# Patient Record
Sex: Female | Born: 1960 | Race: White | Hispanic: No | Marital: Married | Smoking: Former smoker
Health system: Southern US, Community
[De-identification: ages and names within clinical notes are randomized; demographics above are authoritative.]

## PROBLEM LIST (undated history)

## (undated) DIAGNOSIS — F32A Depression, unspecified: Secondary | ICD-10-CM

## (undated) DIAGNOSIS — S83519A Sprain of anterior cruciate ligament of unspecified knee, initial encounter: Secondary | ICD-10-CM

## (undated) DIAGNOSIS — Z22322 Carrier or suspected carrier of Methicillin resistant Staphylococcus aureus: Secondary | ICD-10-CM

## (undated) DIAGNOSIS — F319 Bipolar disorder, unspecified: Secondary | ICD-10-CM

## (undated) DIAGNOSIS — F419 Anxiety disorder, unspecified: Secondary | ICD-10-CM

## (undated) DIAGNOSIS — F329 Major depressive disorder, single episode, unspecified: Secondary | ICD-10-CM

## (undated) DIAGNOSIS — T7840XA Allergy, unspecified, initial encounter: Secondary | ICD-10-CM

## (undated) DIAGNOSIS — M1990 Unspecified osteoarthritis, unspecified site: Secondary | ICD-10-CM

## (undated) DIAGNOSIS — F909 Attention-deficit hyperactivity disorder, unspecified type: Secondary | ICD-10-CM

## (undated) DIAGNOSIS — R112 Nausea with vomiting, unspecified: Secondary | ICD-10-CM

## (undated) DIAGNOSIS — Z9889 Other specified postprocedural states: Secondary | ICD-10-CM

## (undated) HISTORY — DX: Carrier or suspected carrier of methicillin resistant Staphylococcus aureus: Z22.322

## (undated) HISTORY — DX: Sprain of anterior cruciate ligament of unspecified knee, initial encounter: S83.519A

## (undated) HISTORY — DX: Allergy, unspecified, initial encounter: T78.40XA

## (undated) HISTORY — DX: Unspecified osteoarthritis, unspecified site: M19.90

## (undated) HISTORY — PX: EXPLORATORY LAPAROTOMY: SUR591

## (undated) HISTORY — DX: Attention-deficit hyperactivity disorder, unspecified type: F90.9

## (undated) HISTORY — PX: ABDOMINAL HYSTERECTOMY: SHX81

---

## 1997-10-09 ENCOUNTER — Inpatient Hospital Stay (HOSPITAL_COMMUNITY): Admission: AD | Admit: 1997-10-09 | Discharge: 1997-10-11 | Payer: Self-pay | Admitting: Obstetrics and Gynecology

## 1997-10-09 ENCOUNTER — Inpatient Hospital Stay (HOSPITAL_COMMUNITY): Admission: AD | Admit: 1997-10-09 | Discharge: 1997-10-09 | Payer: Self-pay | Admitting: *Deleted

## 1997-11-08 ENCOUNTER — Other Ambulatory Visit: Admission: RE | Admit: 1997-11-08 | Discharge: 1997-11-08 | Payer: Self-pay | Admitting: *Deleted

## 1998-12-22 ENCOUNTER — Other Ambulatory Visit: Admission: RE | Admit: 1998-12-22 | Discharge: 1998-12-22 | Payer: Self-pay | Admitting: *Deleted

## 1999-05-13 ENCOUNTER — Observation Stay (HOSPITAL_COMMUNITY): Admission: AD | Admit: 1999-05-13 | Discharge: 1999-05-13 | Payer: Self-pay | Admitting: Obstetrics and Gynecology

## 1999-08-28 ENCOUNTER — Inpatient Hospital Stay (HOSPITAL_COMMUNITY): Admission: AD | Admit: 1999-08-28 | Discharge: 1999-08-28 | Payer: Self-pay | Admitting: Obstetrics and Gynecology

## 1999-09-02 ENCOUNTER — Encounter (HOSPITAL_COMMUNITY): Admission: RE | Admit: 1999-09-02 | Discharge: 1999-10-06 | Payer: Self-pay | Admitting: Obstetrics and Gynecology

## 1999-09-08 ENCOUNTER — Inpatient Hospital Stay (HOSPITAL_COMMUNITY): Admission: AD | Admit: 1999-09-08 | Discharge: 1999-09-08 | Payer: Self-pay | Admitting: Obstetrics and Gynecology

## 1999-09-09 ENCOUNTER — Inpatient Hospital Stay (HOSPITAL_COMMUNITY): Admission: RE | Admit: 1999-09-09 | Discharge: 1999-09-09 | Payer: Self-pay | Admitting: Obstetrics & Gynecology

## 1999-09-09 ENCOUNTER — Encounter: Payer: Self-pay | Admitting: *Deleted

## 1999-09-14 ENCOUNTER — Inpatient Hospital Stay (HOSPITAL_COMMUNITY): Admission: AD | Admit: 1999-09-14 | Discharge: 1999-09-16 | Payer: Self-pay | Admitting: *Deleted

## 1999-10-05 ENCOUNTER — Inpatient Hospital Stay (HOSPITAL_COMMUNITY): Admission: AD | Admit: 1999-10-05 | Discharge: 1999-10-08 | Payer: Self-pay | Admitting: *Deleted

## 1999-10-05 ENCOUNTER — Encounter (INDEPENDENT_AMBULATORY_CARE_PROVIDER_SITE_OTHER): Payer: Self-pay

## 1999-11-18 ENCOUNTER — Other Ambulatory Visit: Admission: RE | Admit: 1999-11-18 | Discharge: 1999-11-18 | Payer: Self-pay | Admitting: *Deleted

## 2000-12-13 ENCOUNTER — Other Ambulatory Visit: Admission: RE | Admit: 2000-12-13 | Discharge: 2000-12-13 | Payer: Self-pay | Admitting: *Deleted

## 2002-01-18 ENCOUNTER — Other Ambulatory Visit: Admission: RE | Admit: 2002-01-18 | Discharge: 2002-01-18 | Payer: Self-pay | Admitting: *Deleted

## 2003-01-22 ENCOUNTER — Other Ambulatory Visit: Admission: RE | Admit: 2003-01-22 | Discharge: 2003-01-22 | Payer: Self-pay | Admitting: *Deleted

## 2003-08-20 ENCOUNTER — Encounter: Payer: Self-pay | Admitting: Internal Medicine

## 2003-11-20 ENCOUNTER — Encounter: Payer: Self-pay | Admitting: Internal Medicine

## 2004-02-05 ENCOUNTER — Other Ambulatory Visit: Admission: RE | Admit: 2004-02-05 | Discharge: 2004-02-05 | Payer: Self-pay | Admitting: Obstetrics & Gynecology

## 2004-05-26 ENCOUNTER — Encounter (INDEPENDENT_AMBULATORY_CARE_PROVIDER_SITE_OTHER): Payer: Self-pay | Admitting: Specialist

## 2004-05-26 ENCOUNTER — Observation Stay (HOSPITAL_COMMUNITY): Admission: RE | Admit: 2004-05-26 | Discharge: 2004-05-27 | Payer: Self-pay | Admitting: Obstetrics & Gynecology

## 2004-07-05 HISTORY — PX: AUGMENTATION MAMMAPLASTY: SUR837

## 2005-03-03 ENCOUNTER — Encounter (INDEPENDENT_AMBULATORY_CARE_PROVIDER_SITE_OTHER): Payer: Self-pay | Admitting: Specialist

## 2005-03-03 ENCOUNTER — Ambulatory Visit (HOSPITAL_BASED_OUTPATIENT_CLINIC_OR_DEPARTMENT_OTHER): Admission: RE | Admit: 2005-03-03 | Discharge: 2005-03-03 | Payer: Self-pay | Admitting: Plastic Surgery

## 2005-03-24 ENCOUNTER — Ambulatory Visit: Payer: Self-pay | Admitting: Internal Medicine

## 2005-04-13 ENCOUNTER — Other Ambulatory Visit: Admission: RE | Admit: 2005-04-13 | Discharge: 2005-04-13 | Payer: Self-pay | Admitting: Obstetrics & Gynecology

## 2006-01-04 ENCOUNTER — Ambulatory Visit: Payer: Self-pay | Admitting: Family Medicine

## 2006-02-08 ENCOUNTER — Ambulatory Visit: Payer: Self-pay | Admitting: Internal Medicine

## 2006-02-22 ENCOUNTER — Encounter: Admission: RE | Admit: 2006-02-22 | Discharge: 2006-02-22 | Payer: Self-pay | Admitting: Internal Medicine

## 2006-07-21 ENCOUNTER — Ambulatory Visit: Payer: Self-pay | Admitting: Internal Medicine

## 2006-08-09 ENCOUNTER — Ambulatory Visit: Payer: Self-pay | Admitting: Vascular Surgery

## 2006-08-22 ENCOUNTER — Ambulatory Visit: Payer: Self-pay | Admitting: Internal Medicine

## 2006-10-31 ENCOUNTER — Ambulatory Visit: Payer: Self-pay | Admitting: Internal Medicine

## 2006-11-21 ENCOUNTER — Ambulatory Visit: Payer: Self-pay | Admitting: Internal Medicine

## 2006-12-28 ENCOUNTER — Encounter: Payer: Self-pay | Admitting: Internal Medicine

## 2006-12-28 DIAGNOSIS — J309 Allergic rhinitis, unspecified: Secondary | ICD-10-CM | POA: Insufficient documentation

## 2006-12-28 DIAGNOSIS — F909 Attention-deficit hyperactivity disorder, unspecified type: Secondary | ICD-10-CM | POA: Insufficient documentation

## 2006-12-28 DIAGNOSIS — F39 Unspecified mood [affective] disorder: Secondary | ICD-10-CM | POA: Insufficient documentation

## 2007-02-21 ENCOUNTER — Ambulatory Visit: Payer: Self-pay | Admitting: Internal Medicine

## 2007-02-21 DIAGNOSIS — J45909 Unspecified asthma, uncomplicated: Secondary | ICD-10-CM | POA: Insufficient documentation

## 2007-03-03 ENCOUNTER — Telehealth: Payer: Self-pay | Admitting: Internal Medicine

## 2007-03-09 ENCOUNTER — Telehealth: Payer: Self-pay | Admitting: Internal Medicine

## 2007-03-15 ENCOUNTER — Encounter: Payer: Self-pay | Admitting: Internal Medicine

## 2007-03-23 ENCOUNTER — Ambulatory Visit: Payer: Self-pay | Admitting: Cardiology

## 2007-03-23 ENCOUNTER — Ambulatory Visit: Payer: Self-pay | Admitting: Internal Medicine

## 2007-03-23 LAB — CONVERTED CEMR LAB
Bilirubin Urine: NEGATIVE
Blood in Urine, dipstick: NEGATIVE
Glucose, Urine, Semiquant: NEGATIVE
Nitrite: NEGATIVE
Protein, U semiquant: 30
Specific Gravity, Urine: <1.005
Urobilinogen, UA: 0.2
WBC Urine, dipstick: NEGATIVE
pH: 8

## 2007-03-24 LAB — CONVERTED CEMR LAB
ALT: 21 units/L (ref 0–35)
AST: 17 units/L (ref 0–37)
Albumin: 3.9 g/dL (ref 3.5–5.2)
Alkaline Phosphatase: 51 units/L (ref 39–117)
Amylase: 66 units/L (ref 27–131)
BUN: 5 mg/dL — ABNORMAL LOW (ref 6–23)
Basophils Absolute: 0 10*3/uL (ref 0.0–0.1)
Basophils Relative: 0.4 % (ref 0.0–1.0)
Bilirubin, Direct: 0.1 mg/dL (ref 0.0–0.3)
CO2: 31 meq/L (ref 19–32)
Calcium: 10.3 mg/dL (ref 8.4–10.5)
Chloride: 103 meq/L (ref 96–112)
Creatinine, Ser: 0.7 mg/dL (ref 0.4–1.2)
Eosinophils Absolute: 0.2 10*3/uL (ref 0.0–0.6)
Eosinophils Relative: 3.2 % (ref 0.0–5.0)
GFR calc Af Amer: 115 mL/min
GFR calc non Af Amer: 95 mL/min
Glucose, Bld: 109 mg/dL — ABNORMAL HIGH (ref 70–99)
HCT: 43 % (ref 36.0–46.0)
Hemoglobin: 14.7 g/dL (ref 12.0–15.0)
Lipase: 21 units/L (ref 11.0–59.0)
Lymphocytes Relative: 21 % (ref 12.0–46.0)
MCHC: 34.1 g/dL (ref 30.0–36.0)
MCV: 91.8 fL (ref 78.0–100.0)
Monocytes Absolute: 0.5 10*3/uL (ref 0.2–0.7)
Monocytes Relative: 8.1 % (ref 3.0–11.0)
Neutro Abs: 4 10*3/uL (ref 1.4–7.7)
Neutrophils Relative %: 67.3 % (ref 43.0–77.0)
Platelets: 191 10*3/uL (ref 150–400)
Potassium: 4.7 meq/L (ref 3.5–5.1)
RBC: 4.68 M/uL (ref 3.87–5.11)
RDW: 12.4 % (ref 11.5–14.6)
Sodium: 141 meq/L (ref 135–145)
Total Bilirubin: 0.8 mg/dL (ref 0.3–1.2)
Total Protein: 6.8 g/dL (ref 6.0–8.3)
WBC: 5.9 10*3/uL (ref 4.5–10.5)

## 2007-04-14 ENCOUNTER — Telehealth: Payer: Self-pay | Admitting: Family Medicine

## 2007-04-18 ENCOUNTER — Telehealth: Payer: Self-pay | Admitting: Internal Medicine

## 2007-05-10 ENCOUNTER — Telehealth: Payer: Self-pay | Admitting: Internal Medicine

## 2007-05-16 ENCOUNTER — Encounter: Payer: Self-pay | Admitting: Internal Medicine

## 2007-05-18 ENCOUNTER — Telehealth: Payer: Self-pay | Admitting: Internal Medicine

## 2007-05-19 ENCOUNTER — Telehealth (INDEPENDENT_AMBULATORY_CARE_PROVIDER_SITE_OTHER): Payer: Self-pay | Admitting: *Deleted

## 2007-06-14 ENCOUNTER — Telehealth: Payer: Self-pay | Admitting: *Deleted

## 2007-06-15 ENCOUNTER — Telehealth: Payer: Self-pay | Admitting: *Deleted

## 2007-07-17 ENCOUNTER — Telehealth: Payer: Self-pay | Admitting: Internal Medicine

## 2007-07-20 ENCOUNTER — Telehealth: Payer: Self-pay | Admitting: Internal Medicine

## 2007-07-24 ENCOUNTER — Encounter: Payer: Self-pay | Admitting: Internal Medicine

## 2007-10-18 ENCOUNTER — Telehealth: Payer: Self-pay | Admitting: Internal Medicine

## 2007-11-17 ENCOUNTER — Telehealth: Payer: Self-pay | Admitting: Internal Medicine

## 2008-03-18 ENCOUNTER — Telehealth: Payer: Self-pay | Admitting: *Deleted

## 2008-04-22 ENCOUNTER — Ambulatory Visit: Payer: Self-pay | Admitting: Internal Medicine

## 2008-05-02 ENCOUNTER — Telehealth: Payer: Self-pay | Admitting: *Deleted

## 2008-09-02 ENCOUNTER — Telehealth: Payer: Self-pay | Admitting: Internal Medicine

## 2008-09-27 ENCOUNTER — Encounter: Payer: Self-pay | Admitting: Internal Medicine

## 2008-10-08 ENCOUNTER — Ambulatory Visit: Payer: Self-pay | Admitting: Internal Medicine

## 2008-10-08 DIAGNOSIS — M129 Arthropathy, unspecified: Secondary | ICD-10-CM | POA: Insufficient documentation

## 2008-10-08 DIAGNOSIS — M5030 Other cervical disc degeneration, unspecified cervical region: Secondary | ICD-10-CM | POA: Insufficient documentation

## 2008-10-08 DIAGNOSIS — M542 Cervicalgia: Secondary | ICD-10-CM

## 2008-12-04 ENCOUNTER — Telehealth: Payer: Self-pay | Admitting: Internal Medicine

## 2009-02-27 ENCOUNTER — Ambulatory Visit: Payer: Self-pay | Admitting: Vascular Surgery

## 2009-03-17 ENCOUNTER — Telehealth: Payer: Self-pay | Admitting: *Deleted

## 2009-03-31 ENCOUNTER — Ambulatory Visit: Payer: Self-pay | Admitting: Family Medicine

## 2009-04-01 ENCOUNTER — Telehealth: Payer: Self-pay | Admitting: Family Medicine

## 2009-04-02 ENCOUNTER — Ambulatory Visit: Payer: Self-pay | Admitting: Family Medicine

## 2009-04-09 ENCOUNTER — Ambulatory Visit: Payer: Self-pay | Admitting: Internal Medicine

## 2009-04-09 DIAGNOSIS — L02219 Cutaneous abscess of trunk, unspecified: Secondary | ICD-10-CM

## 2009-04-09 DIAGNOSIS — L03319 Cellulitis of trunk, unspecified: Secondary | ICD-10-CM

## 2009-04-09 DIAGNOSIS — R030 Elevated blood-pressure reading, without diagnosis of hypertension: Secondary | ICD-10-CM

## 2009-04-10 ENCOUNTER — Encounter: Payer: Self-pay | Admitting: Internal Medicine

## 2009-04-15 ENCOUNTER — Telehealth: Payer: Self-pay | Admitting: Internal Medicine

## 2009-04-30 ENCOUNTER — Telehealth: Payer: Self-pay | Admitting: *Deleted

## 2009-05-27 ENCOUNTER — Telehealth: Payer: Self-pay | Admitting: Internal Medicine

## 2009-06-24 ENCOUNTER — Telehealth: Payer: Self-pay | Admitting: Internal Medicine

## 2009-08-01 ENCOUNTER — Telehealth: Payer: Self-pay | Admitting: Internal Medicine

## 2009-08-02 ENCOUNTER — Encounter: Payer: Self-pay | Admitting: Internal Medicine

## 2009-08-14 ENCOUNTER — Encounter: Payer: Self-pay | Admitting: Internal Medicine

## 2009-08-14 ENCOUNTER — Ambulatory Visit: Payer: Self-pay | Admitting: Vascular Surgery

## 2009-08-19 ENCOUNTER — Encounter: Payer: Self-pay | Admitting: Internal Medicine

## 2009-09-11 ENCOUNTER — Encounter: Payer: Self-pay | Admitting: Internal Medicine

## 2009-09-19 ENCOUNTER — Encounter: Payer: Self-pay | Admitting: Internal Medicine

## 2009-11-03 ENCOUNTER — Telehealth: Payer: Self-pay | Admitting: Internal Medicine

## 2009-11-11 ENCOUNTER — Ambulatory Visit: Payer: Self-pay | Admitting: Internal Medicine

## 2009-12-03 ENCOUNTER — Telehealth: Payer: Self-pay | Admitting: *Deleted

## 2009-12-25 ENCOUNTER — Ambulatory Visit: Payer: Self-pay | Admitting: Internal Medicine

## 2009-12-25 ENCOUNTER — Observation Stay (HOSPITAL_COMMUNITY): Admission: EM | Admit: 2009-12-25 | Discharge: 2009-12-26 | Payer: Self-pay | Admitting: Emergency Medicine

## 2009-12-25 ENCOUNTER — Ambulatory Visit: Payer: Self-pay | Admitting: Cardiology

## 2009-12-25 DIAGNOSIS — R0602 Shortness of breath: Secondary | ICD-10-CM

## 2009-12-26 ENCOUNTER — Encounter (INDEPENDENT_AMBULATORY_CARE_PROVIDER_SITE_OTHER): Payer: Self-pay | Admitting: Emergency Medicine

## 2010-02-23 ENCOUNTER — Telehealth: Payer: Self-pay | Admitting: *Deleted

## 2010-03-11 ENCOUNTER — Ambulatory Visit: Payer: Self-pay | Admitting: Internal Medicine

## 2010-03-11 DIAGNOSIS — N951 Menopausal and female climacteric states: Secondary | ICD-10-CM | POA: Insufficient documentation

## 2010-03-17 ENCOUNTER — Encounter: Payer: Self-pay | Admitting: Internal Medicine

## 2010-06-08 ENCOUNTER — Telehealth: Payer: Self-pay | Admitting: Internal Medicine

## 2010-07-20 ENCOUNTER — Ambulatory Visit
Admission: RE | Admit: 2010-07-20 | Discharge: 2010-07-20 | Payer: Self-pay | Source: Home / Self Care | Attending: Internal Medicine | Admitting: Internal Medicine

## 2010-07-20 DIAGNOSIS — F411 Generalized anxiety disorder: Secondary | ICD-10-CM | POA: Insufficient documentation

## 2010-07-20 DIAGNOSIS — F322 Major depressive disorder, single episode, severe without psychotic features: Secondary | ICD-10-CM | POA: Insufficient documentation

## 2010-08-04 NOTE — Letter (Signed)
Summary: Pre Visit Letter Revised  [facility]  [address] [place], [place] [address]   Phone: [phone]  Fax: [phone]        03/17/2010 MRN: [id number] [facility] [address] [place], [place]  [address]             Procedure Date:  Oct 28 at 11:30am   Welcome to the Gastroenterology Division at [facility].    You are scheduled to see a nurse for your pre-procedure visit on 04-15-10 at  1pm on the 3rd floor at [facility], [address]  We ask that you try to arrive at our office 15 minutes prior to your appointment time to allow for check-in.  Please take a minute to review the attached form.  If you answer "Yes" to one or more of the questions on the first page, we ask that you call the person listed at your earliest opportunity.  If you answer "No" to all of the questions, please complete the rest of the form and bring it to your appointment.    Your nurse visit will consist of discussing your medical and surgical history, your immediate family medical history, and your medications.   If you are unable to list all of your medications on the form, please bring the medication bottles to your appointment and we will list them.  We will need to be aware of both prescribed and over the counter drugs.  We will need to know exact dosage information as well.    Please be prepared to read and sign documents such as consent forms, a financial agreement, and acknowledgement forms.  If necessary, and with your consent, a friend or relative is welcome to sit-in on the nurse visit with you.  Please bring your insurance card so that we may make a copy of it.  If your insurance requires a referral to see a specialist, please bring your referral form from your primary care physician.  No co-pay is required for this nurse visit.     If you cannot keep your appointment, please call [phone] to cancel or reschedule prior to your appointment date.  This  allows [place] the opportunity to schedule an appointment for another patient in need of care.    Thank you for choosing [facility] for your medical needs.  We appreciate the opportunity to care for you.  Please visit [place] at our website  to learn more about our practice.  Sincerely, The Gastroenterology Division

## 2010-08-04 NOTE — Progress Notes (Signed)
Summary: refill on adderall.  Phone Note Call from Patient Call back at [phone]   Caller: Patient Summary of Call: Pt called wanting a 90 days supply for adderall. Initial call taken by: Romualdo Bolk, CMA Duncan Dull),  August 01, 2009 2:13 PM  Follow-up for Phone Call        Pt aware that rx is ready to pick up. Follow-up by: Romualdo Bolk, CMA (AAMA),  August 01, 2009 3:52 PM    Prescriptions: ADDERALL XR 20 MG  CP24 (AMPHETAMINE-DEXTROAMPHETAMINE) 1 by mouth once daily.  #90 x 0   Entered by:   Romualdo Bolk, CMA (AAMA)   Authorized by:   Madelin Headings MD   Signed by:   Romualdo Bolk, CMA (AAMA) on 08/01/2009   Method used:   Print then Give to Patient   RxID:   [id number]

## 2010-08-04 NOTE — Assessment & Plan Note (Signed)
Summary: chest pain   Vital Signs:  Patient profile:   50 year old female Menstrual status:  hysterectomy Weight:      121 pounds O2 Sat:      100 % on Room air Temp:     98.5 degrees F oral Pulse rate:   88 / minute Pulse rhythm:   regular Resp:     26 per minute BP sitting:   110 / 80  (left arm) Cuff size:   regular  Vitals Entered By: Romualdo Bolk, CMA (AAMA) (December 25, 2009 10:11 AM)  O2 Flow:  Room air  Serial Vital Signs/Assessments:  Time      Position  BP       Pulse  Resp  Temp     By 10:21 AM            130/80                         Romualdo Bolk, CMA (AAMA)  Comments: 10:21 AM This was laying down. Pt got dizzy after while setting up after getting EKG done. Romualdo Bolk, CMA (AAMA)  December 25, 2009 10:22 AM  By: Romualdo Bolk, CMA (AAMA)   CC: Chest pain, sob, sweating, tingling down rt arm, shaking, nausea. This happened around 6:30am  Pt was laying down while doing EKG then when she went to set up got dizzy. Pt 130/80   History of Present Illness: Sarah Peterson comes in today  with acute onset this am of severe 10/10 mid pressure and sharp chest pain that was associated with SOB and then nausea sweating and actual vomiting.   NO meds this am just water and  a few sips of coffee. She  caller her husband who was out of town  took asa and .25 of xanax and laid down but persisted .   then dizziness and sob.  pain is less now 2/10 . no cough diarrhea.  Lots of stress as usual but  nothing new this am . didnt sleep well last pm .   no pills last pm. also co at onset of feeling tingling in right neck arm and felt weak?    no  present now.  Was called to see her emergently   to evaluate. No hx of heart disease. does take adhd medication but none today. usually exercises.  Has asthma but not flaring.  never had pain like this before.   Preventive Screening-Counseling & Management  Alcohol-Tobacco     Alcohol drinks/day: 2     Alcohol type:  vodka     Smoking Status: never  Caffeine-Diet-Exercise     Caffeine use/day: 1     Does Patient Exercise: yes     Type of exercise: cardio and wts     Times/week: 7  Allergies (verified): 1)  ! Augmentin (Amoxicillin-Pot Clavulanate)  Past History:  Past medical, surgical, family and social histories (including risk factors) reviewed, and no changes noted (except as noted below).  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 Laparotomy-exploratory Hysterectomy    Family History: Reviewed history from 04/09/2009 and no changes required. see record [facility]  ADD Father died of aneurysm age 60 Mom CHF   Social History: Reviewed history from 04/09/2009 and no changes required.  Married Never Smoked Regular exercise-yes    hhof 5?   husband works in Recruitment consultant.  Review of Systems       The patient complains of anorexia, chest pain, and dyspnea on exertion.  The patient denies fever, weight loss, weight gain, vision loss, decreased hearing, hoarseness, peripheral edema, prolonged cough, headaches, hemoptysis, melena, hematochezia, severe indigestion/heartburn, hematuria, muscle weakness, suspicious skin lesions, transient blindness, difficulty walking, depression, unusual weight change, abnormal bleeding, enlarged lymph nodes, angioedema, breast masses, and abdominal pain.         rest of ros neg or [place]  except for HPI  Physical Exam  General:  wd wn iin mild resp distress but able to talk and color is good .   pulse ox on ear 99-100  unable to get on finger   after  observation adn o2   looks calmer and less anxious Head:  normocephalic and atraumatic.   Eyes:  vision grossly intact, pupils equal, and pupils round.   Ears:  R ear normal and L ear normal.   Mouth:  pharynx pink and moist.   Neck:  No  deformities, masses, or tenderness noted. Lungs:  no intercostal retractions, no accessory muscle use, normal breath sounds, no dullness, no fremitus, and no crackles.  increase resp  24   Heart:  regular rhythm, no murmur, no gallop, no rub, and no lifts.  rate 88 Abdomen:  Bowel sounds positive,abdomen soft and non-tender without masses, organomegaly or hernias noted. Msk:  normal ROM, no joint swelling, no joint warmth, no redness over joints, no joint deformities, and no joint instability.  good muscle mass Pulses:  pulses intact without delay   nl perfusion Extremities:  no clubbing cyanosis or edema  Neurologic:  non focal  alert & oriented X3 and cranial nerves iII-XII intact.   no obv deficit  Skin:  turgor normal, color normal, no ecchymoses, no petechiae, no purpura, and no ulcerations.   Cervical Nodes:  No lymphadenopathy noted Axillary Nodes:  No palpable lymphadenopathy Psych:  Oriented X3 and memory intact for recent and remote.  anxious   nl cognition  Additional Exam:  EKG sinus rhythym   dec r in ant lead  no acute changes .   Impression & Recommendations:  Problem # 1:  CHEST PAIN, ACUTE (ICD-786.50)   Worrisome associated  .sx dypsnea and vomiting   ALthough low risk context  ,severity of .sx  required need to r/o CV /pulmonary events. Doesnt not seem like asthma  or particularly  esoophageal  unless is spasm.   ho hx of same .     needs emergent  ED evaluation .     Orders: EKG w/ Interpretation (93000)  Problem # 2:  ASTHMA (ICD-493.90) seems quiescent Her updated medication list for this problem includes:    Proair Hfa 108 (90 Base) Mcg/act Aers (Albuterol sulfate) ..... Inhale 2 puffs every 4-6 hours as needed  Problem # 3:  ELEVATED BLOOD PRESSURE WITHOUT DIAGNOSIS OF HYPERTENSION (ICD-796.2) had been better and controlled   Problem # 4:  ADHD (ICD-314.01) stable  . no meds today   Problem # 5:  right arm numbness / weakness?  now resolved. I suspect this  may be related to c spine disease but  was assoc with event and that is atypical.  Complete Medication List: 1)  Proair Hfa 108 (90 Base) Mcg/act Aers (Albuterol sulfate) .... Inhale 2 puffs every 4-6 hours as needed 2)  Xanax 0.25 Mg Tabs (Alprazolam) .Marland KitchenMarland KitchenMarland Kitchen  Take 1 tablet by mouth once a day as needed 3)  Zoloft 100 Mg Tabs (Sertraline hcl) .... Take 1 1/2  tablet by mouth once a day 4)  Adderall Xr 20 Mg Cp24 (Amphetamine-dextroamphetamine) .Marland Kitchen.. 1 by mouth once daily. 5)  Caltrate 600 1500 Mg Tabs (Calcium carbonate) 6)  Vitamin D 1000 Unit Tabs (Cholecalciferol) 7)  Aspirin 81 Mg Tabs (Aspirin) 8)  Magnesium Oxide 250 Mg Tabs (Magnesium oxide)  Patient Instructions: 1)  disc   further evaluation needed that  require ED.    EMS called and transpote to Hospital . disc with daughter also.   prolonged visit  and observation and coordination  of care .

## 2010-08-04 NOTE — Letter (Signed)
Summary: [facility]  [facility]   Imported By: Maryln Gottron 09/26/2009 13:04:04  _____________________________________________________________________  External Attachment:    Type:   Image     Comment:   External Document

## 2010-08-04 NOTE — Progress Notes (Signed)
Summary: refill  Phone Note Call from Patient Call back at Home Phone [phone]   Caller: Patient Summary of Call: Pt needs a refill on adderall  Initial call taken by: Romualdo Bolk, CMA (AAMA),  June 08, 2010 1:25 PM  Follow-up for Phone Call        pt aware that rx will be ready in the am. Follow-up by: Romualdo Bolk, CMA (AAMA),  June 08, 2010 1:25 PM    Prescriptions: ADDERALL XR 20 MG  CP24 (AMPHETAMINE-DEXTROAMPHETAMINE) 1 by mouth once daily.  #90 x 0   Entered by:   Romualdo Bolk, CMA (AAMA)   Authorized by:   Madelin Headings MD   Signed by:   Romualdo Bolk, CMA (AAMA) on 06/08/2010   Method used:   Print then Give to Patient   RxID:   [id number]

## 2010-08-04 NOTE — Letter (Signed)
Summary: [facility]  [facility]   Imported By: Maryln Gottron 09/02/2009 08:34:56  _____________________________________________________________________  External Attachment:    Type:   Image     Comment:   External Document

## 2010-08-04 NOTE — Progress Notes (Signed)
Summary: refill  Phone Note From Pharmacy   Caller: [facility] [address] Reason for Call: Needs renewal Details for Reason: zoloft Initial call taken by: Romualdo Bolk, CMA Duncan Dull),  December 03, 2009 3:54 PM  Follow-up for Phone Call        rx sent to pharmacy Follow-up by: Romualdo Bolk, CMA Duncan Dull),  December 03, 2009 3:55 PM    Prescriptions: ZOLOFT 100 MG TABS (SERTRALINE HCL) Take 1 1/2  tablet by mouth once a day  #45 Tablet x 5   Entered by:   Romualdo Bolk, CMA (AAMA)   Authorized by:   Madelin Headings MD   Signed by:   Romualdo Bolk, CMA (AAMA) on 12/03/2009   Method used:   Electronically to        [facility] [address] (retail)       [address]       [place], [place]  [address]       Ph: [phone] or [phone]       Fax: [phone]   RxID:   [id number]

## 2010-08-04 NOTE — Progress Notes (Signed)
Summary: refill on adderall  Phone Note Call from Patient Call back at Work Phone [phone]   Caller: Patient Summary of Call: Pt wants a refill on Adderall Initial call taken by: Romualdo Bolk, CMA Duncan Dull),  February 23, 2010 9:45 AM  Follow-up for Phone Call        Pt aware rx ready to pick up. Follow-up by: Romualdo Bolk, CMA Duncan Dull),  February 24, 2010 9:50 AM  Additional Follow-up for Phone Call Additional follow up Details #1::        patient needs follow up visit    Additional Follow-up by: Madelin Headings MD,  February 26, 2010 12:00 PM    Additional Follow-up for Phone Call Additional follow up Details #2::    Pt aware and will make a follow up appt when she picks up rx. Follow-up by: Romualdo Bolk, CMA Duncan Dull),  February 27, 2010 4:36 PM  Prescriptions: ADDERALL XR 20 MG  CP24 (AMPHETAMINE-DEXTROAMPHETAMINE) 1 by mouth once daily.  #90 x 0   Entered by:   Romualdo Bolk, CMA (AAMA)   Authorized by:   Madelin Headings MD   Signed by:   Romualdo Bolk, CMA (AAMA) on 02/23/2010   Method used:   Print then Give to Patient   RxID:   [id number]

## 2010-08-04 NOTE — Assessment & Plan Note (Signed)
Summary: follow up on meds/ssc   Vital Signs:  Patient profile:   50 year old female Menstrual status:  hysterectomy Height:      64 inches Weight:      122 pounds BMI:     21.02 Pulse rate:   60 / minute BP sitting:   120 / 80  (right arm) Cuff size:   regular  Vitals Entered By: Romualdo Bolk, CMA (AAMA) (Nov 11, 2009 11:20 AM) CC: Follow-up visit on meds   History of Present Illness: Sarah Peterson comesin comes in today  for follow up of meds for multiple medical problems   Resp :  ocass with exercise .      as needed . ADHD   1 by mouth once daily  last to about 4 pm.   No effect on sleep    No cp sob  and no se. ? if moody or now.  Husband out of town stress and weepy a times Allergies stable  Had gyne check and labs with Lipoprofice and lipids good and a goal     Preventive Screening-Counseling & Management  Alcohol-Tobacco     Alcohol drinks/day: 2     Alcohol type: vodka     Smoking Status: never  Caffeine-Diet-Exercise     Caffeine use/day: 1     Does Patient Exercise: yes     Type of exercise: cardio and wts     Times/week: 7  Current Medications (verified): 1)  Proair Hfa 108 (90 Base) Mcg/act Aers (Albuterol Sulfate) .... Inhale 2 Puffs Every 4-6 Hours As Needed 2)  Xanax 0.25 Mg Tabs (Alprazolam) .... Take 1 Tablet By Mouth Once A Day As Needed 3)  Zoloft 100 Mg Tabs (Sertraline Hcl) .... Take 1 1/2  Tablet By Mouth Once A Day 4)  Adderall Xr 20 Mg  Cp24 (Amphetamine-Dextroamphetamine) .Marland Kitchen.. 1 By Mouth Once Daily. 5)  Caltrate 600 1500 Mg Tabs (Calcium Carbonate) 6)  Vitamin D 1000 Unit Tabs (Cholecalciferol) 7)  Aspirin 81 Mg  Tabs (Aspirin) 8)  Magnesium Oxide 250 Mg Tabs (Magnesium Oxide)  Allergies (verified): 1)  ! Augmentin (Amoxicillin-Pot Clavulanate)  Past History:  Past medical, surgical, family and social histories (including risk factors) reviewed, and no changes noted (except as noted below).  Past Medical History: Reviewed  history from 04/09/2009 and no changes required. Allergic rhinitis Asthma adhd   Hx mrsa skin   Past Surgical History: Reviewed history from 04/22/2008 and no changes required. Laparotomy-exploratory Hysterectomy    Past History:  Care Management: Orthopedics: Dr. Shon Baton Gynecology: Dr. Jennette Kettle  Family History: Reviewed history from 04/09/2009 and no changes required. see record [place]  ADD  Social History: Reviewed history from 04/09/2009 and no changes required. Married Never Smoked Regular exercise-yes    hhof 5?   husband works in Recruitment consultant. Caffeine use/day:  1  Review of Systems  The patient denies anorexia, fever, weight loss, weight gain, vision loss, decreased hearing, hoarseness, chest pain, syncope, hemoptysis, abdominal pain, melena, hematochezia, difficulty walking, unusual weight change, abnormal bleeding, enlarged lymph nodes, and angioedema.    Physical Exam  General:  Well-developed,well-nourished,in no acute distress; alert,appropriate and cooperative throughout examination Head:  normocephalic and atraumatic.   Eyes:  vision grossly intact, pupils equal, and pupils round.   Ears:  R ear normal and L ear normal.   Mouth:  pharynx pink and moist.   Neck:  No deformities, masses, or tenderness noted. Lungs:  Normal respiratory effort, chest expands symmetrically. Lungs  are clear to auscultation, no crackles or wheezes. Heart:  Normal rate and regular rhythm. S1 and S2 normal without gallop, murmur, click, rub or other extra sounds. Abdomen:  Bowel sounds positive,abdomen soft and non-tender without masses, organomegaly or   noted. Pulses:  pulses intact without delay   Extremities:  no clubbing cyanosis or edema  Neurologic:  on focal  Skin:  turgor normal, color normal, no ecchymoses, no petechiae, and no purpura.  scar right leg Cervical Nodes:  No lymphadenopathy noted   Impression & Recommendations:  Problem # 1:  ADHD (ICD-314.01) stable on  medication without sig side effect . options discussed but rec no change at present  Problem # 2:  ASTHMA, EXTRINSIC NOS (ICD-493.00) controlled and using as needed meds The following medications were removed from the medication list:    Qvar 40 Mcg/act Aers (Beclomethasone dipropionate) ..... Inhale 2 puff using inhaler twice a day Her updated medication list for this problem includes:    Proair Hfa 108 (90 Base) Mcg/act Aers (Albuterol sulfate) ..... 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  Complete Medication List: 1)  Proair Hfa 108 (90 Base) Mcg/act Aers (Albuterol sulfate) .... Inhale 2 puffs every 4-6 hours as needed 2)  Xanax 0.25 Mg Tabs (Alprazolam) .... Take 1 tablet by mouth once a day as needed 3)  Zoloft 100 Mg Tabs (Sertraline hcl) .... Take 1 1/2  tablet by mouth once a day 4)  Adderall Xr 20 Mg Cp24 (Amphetamine-dextroamphetamine) .Marland Kitchen.. 1 by mouth once daily. 5)  Caltrate 600 1500 Mg Tabs (Calcium carbonate) 6)  Vitamin D 1000 Unit Tabs (Cholecalciferol) 7)  Aspirin 81 Mg Tabs (Aspirin) 8)  Magnesium Oxide 250 Mg Tabs (Magnesium oxide)  Patient Instructions: 1)  Iincrease to 150 mg   zoloft for a t least a month to see  is this helps some of the mood. issues. 2)  Call in 1 month about progress . 3)  Ok to remain on other meds.  4)  Get a copy of  labs  to [place].  5)  Please schedule a follow-up appointment in 6 months .  Prescriptions: ADDERALL XR 20 MG  CP24 (AMPHETAMINE-DEXTROAMPHETAMINE) 1 by mouth once daily.  #90 x 0   Entered and Authorized by:   Madelin Headings MD   Signed by:   Madelin Headings MD on 11/11/2009   Method used:   Print then Give to Patient   RxID:   [id number]

## 2010-08-04 NOTE — Progress Notes (Signed)
Summary: refill on adderall  Phone Note Call from Patient Call back at Home Phone [phone]   Caller: Patient Summary of Call: Pt needs a refill on adderall Initial call taken by: Romualdo Bolk, CMA (AAMA),  Nov 03, 2009 4:17 PM  Follow-up for Phone Call        ok for one month    needs OV  before   runs out  Follow-up by: Madelin Headings MD,  Nov 04, 2009 5:19 PM  Additional Follow-up for Phone Call Additional follow up Details #1::        LMTOCB- I need to know how many she takes a month. It looks like she took 2 a day at one point but I want to make sure.  Additional Follow-up by: Romualdo Bolk, CMA Duncan Dull),  Nov 05, 2009 11:43 AM    Additional Follow-up for Phone Call Additional follow up Details #2::    Spoke to pt and she is only taking 1 a day. Pt aware that rx will be ready to pick up on 5/5. Appt made. Follow-up by: Romualdo Bolk, CMA (AAMA),  Nov 05, 2009 1:28 PM  Prescriptions: ADDERALL XR 20 MG  CP24 (AMPHETAMINE-DEXTROAMPHETAMINE) 1 by mouth once daily.  #30 x 0   Entered by:   Romualdo Bolk, CMA (AAMA)   Authorized by:   Madelin Headings MD   Signed by:   Romualdo Bolk, CMA (AAMA) on 11/05/2009   Method used:   Print then Give to Patient   RxID:   [id number]

## 2010-08-04 NOTE — Assessment & Plan Note (Signed)
Summary: FU ON MED/NJR   Vital Signs:  Patient profile:   50 year old female Menstrual status:  hysterectomy Weight:      126 pounds Pulse rate:   60 / minute BP sitting:   100 / 60  (right arm) Cuff size:   regular  Vitals Entered By: Romualdo Bolk, CMA (AAMA) (March 11, 2010 12:09 PM) CC: Follow-up visit on meds   History of Present Illness: Sarah Peterson comes in today  for  follow up of    medical issues .   Her last visit was for acute chest pain where she was evaluated in the emergency room and it was felt not be cardiac. Since that time she has not had significant recurrence. However she has had a problem with her neck arm and shoulder. She has been seen at [facility] and undergone some local injections. Had cyst right arm shoulder .  neck is some better  but lumbar is hurting.  she has been using oxycodone 5 mg as needed not on a daily basis her last prescription was in March and she was given 80 of these. She has few laughed and discussed with the PA at [facility] but because she feels the hydrocodone is not very effective and she would  have to drive to [facility] to get a refill on her medication. She asks if we can give her some in the meantime until she goes back. She is able to exercise but is not running.  Adhd" no prob with meds . NOt felt to affect mood  Mood :has been weaning the Zoloft   has noted no change in mood but  menopausal problems an issues Menopausal.:   sleep and flushses problematic  Asthma : stable Had full set of labs done per her gyne and here to review .   ( normal ) Hasnt had a  screening colonsocopy yet  . No signs   Preventive Screening-Counseling & Management  Alcohol-Tobacco     Alcohol drinks/day: 2     Alcohol type: vodka     Smoking Status: never  Caffeine-Diet-Exercise     Caffeine use/day: 1     Does Patient Exercise: yes     Type of exercise: cardio and wts     Times/week: 7  Current Medications (verified): 1)  Proair Hfa 108 (90  Base) Mcg/act Aers (Albuterol Sulfate) .... Inhale 2 Puffs Every 4-6 Hours As Needed 2)  Xanax 0.25 Mg Tabs (Alprazolam) .... Take 1 Tablet By Mouth Once A Day As Needed 3)  Zoloft 100 Mg Tabs (Sertraline Hcl) .... Take 1 Every Other Day 4)  Adderall Xr 20 Mg  Cp24 (Amphetamine-Dextroamphetamine) .Marland Kitchen.. 1 By Mouth Once Daily. 5)  Caltrate 600 1500 Mg Tabs (Calcium Carbonate) 6)  Vitamin D 1000 Unit Tabs (Cholecalciferol) 7)  Magnesium Oxide 250 Mg Tabs (Magnesium Oxide) 8)  Oxycodone Hcl 5 Mg Tabs (Oxycodone Hcl) .Marland Kitchen.. 1-2 Q 4-6 Hours As Needed  Allergies (verified): 1)  ! Augmentin (Amoxicillin-Pot Clavulanate)  Past History:  Past medical, surgical, family and social histories (including risk factors) reviewed, and no changes noted (except as noted below).  Past Medical History: Reviewed history from 12/25/2009 and no changes required. Allergic rhinitis Asthma adhd   Hx mrsa skin  evaluated at [facility] for djd back and cspine   some foraminal stenosis  Past Surgical History: Reviewed history from 04/22/2008 and no changes required. Laparotomy-exploratory Hysterectomy    Past History:  Care Management: Orthopedics: Dr. Shon Baton Gynecology: Dr.  Jennette Kettle DUKEManson Passey  Family History: Reviewed history from 12/25/2009 and no changes required. see record [place]  ADD Father died of aneurysm age 12 Mom CHF   Social History: Reviewed history from 04/09/2009 and no changes required. Married Never Smoked Regular exercise-yes    hhof 5?   husband works in Recruitment consultant.  Review of Systems  The patient denies anorexia, fever, weight loss, weight gain, vision loss, decreased hearing, chest pain, syncope, dyspnea on exertion, peripheral edema, prolonged cough, headaches, abdominal pain, melena, hematochezia, severe indigestion/heartburn, abnormal bleeding, enlarged lymph nodes, and angioedema.         some neck and arm pain and back painas in HPI   Physical Exam  General:   Well-developed,well-nourished,in no acute distress; alert,appropriate and cooperative throughout examination Head:  normocephalic and atraumatic.   Eyes:  vision grossly intact, pupils equal, and pupils round.   Ears:  R ear normal and L ear normal.   Neck:  No deformities, masses, or tenderness noted. Lungs:  Normal respiratory effort, chest expands symmetrically. Lungs are clear to auscultation, no crackles or wheezes. Heart:  Normal rate and regular rhythm. S1 and S2 normal without gallop, murmur, click, rub or other extra sounds. Abdomen:  Bowel sounds positive,abdomen soft and non-tender without masses, organomegaly or hernias noted. Msk:  nl rom and no atrophy noted  prefers to stand Pulses:  pulses intact without delay   Extremities:  no clubbing cyanosis or edema  Neurologic:  alert & oriented X3 and gait normal.  some increase motor activity  no focal weakness Skin:  turgor normal, color normal, no petechiae, and no purpura.  slightly dry skin Cervical Nodes:  No lymphadenopathy noted Axillary Nodes:  No palpable lymphadenopathy Inguinal Nodes:  No significant adenopathy Psych:  Oriented X3, memory intact for recent and remote, good eye contact, not anxious appearing, and not depressed appearing.   labs reviewed from gyne and normal   Impression & Recommendations:  Problem # 1:  ADHD (ICD-314.01) Assessment Unchanged ok to continue meds   seemingly no se of med   Problem # 2:  ELEVATED BLOOD PRESSURE WITHOUT DIAGNOSIS OF HYPERTENSION (ICD-796.2) Assessment: Improved normal today   Problem # 3:  DEGENERATIVE DISC DISEASE, CERVICAL SPINE (ICD-722.4) problematic also with her back   .  receiving her care at [facility] but currently on a schedule to medication. discussed that pain medication should be given by one facility however because of the distance to [facility] today will refill a small amount of her medication and we should notify her spine specialist. For the refills should come from  them. It is still possible that she may do just as well on hydrocodone at this point it hasn't worked in the past.   she agrees.  need to avoid polypharmacy.  Problem # 4:  HOT FLASHES (ICD-627.2) not a good time to WD the ssri  would take low dose daily and see if that helps her symptoms.     Problem # 5:  Preventive Health Care (ICD-V70.0) she's due for screening colonoscopy will put in referral.  Complete Medication List: 1)  Proair Hfa 108 (90 Base) Mcg/act Aers (Albuterol sulfate) .... Inhale 2 puffs every 4-6 hours as needed 2)  Xanax 0.25 Mg Tabs (Alprazolam) .... Take 1 tablet by mouth once a day as needed 3)  Zoloft 100 Mg Tabs (Sertraline hcl) .... Take 1/2 -1 by mouth once daily 4)  Adderall Xr 20 Mg Cp24 (Amphetamine-dextroamphetamine) .Marland Kitchen.. 1 by mouth once daily. 5)  Caltrate 600 1500 Mg Tabs (Calcium carbonate) 6)  Vitamin D 1000 Unit Tabs (Cholecalciferol) 7)  Magnesium Oxide 250 Mg Tabs (Magnesium oxide) 8)  Oxycodone Hcl 5 Mg Tabs (Oxycodone hcl) .Marland Kitchen.. 1-2 q 4-6 hours as needed  Other Orders: Gastroenterology Referral (GI)  Patient Instructions: 1)  try  zoloft every day    ( 50 - 100 mg)   instead of skipping.   day. 2)  will give pain med  this time but to inform  your specialist that we did did this.    3)  avoid pain meds plus alcohol.  4)  return office visit in 6 months or as needed  5)  Your lab tests are good.  6)  will contact you about colonscopy referral. Prescriptions: OXYCODONE HCL 5 MG TABS (OXYCODONE HCL) 1-2 q 4-6 hours as needed  #40 x 0   Entered and Authorized by:   Madelin Headings MD   Signed by:   Madelin Headings MD on 03/11/2010   Method used:   Print then Give to Patient   RxID:   [id number]

## 2010-08-04 NOTE — Miscellaneous (Signed)
Summary: Flu Vaccination/[facility]  Flu Vaccination/[facility]   Imported By: Maryln Gottron 08/06/2009 11:25:42  _____________________________________________________________________  External Attachment:    Type:   Image     Comment:   External Document

## 2010-08-04 NOTE — Medication Information (Signed)
Summary: Adjustment to Adderall prescription/[facility]  Adjustment to Adderall prescription/[facility]   Imported By: Maryln Gottron 08/19/2009 12:14:01  _____________________________________________________________________  External Attachment:    Type:   Image     Comment:   External Document

## 2010-08-06 NOTE — Assessment & Plan Note (Signed)
Summary: SEVERE DEPRESSION/PROBLEMS WITH MEDS/CJR   Vital Signs:  Patient profile:   50 year old female Menstrual status:  hysterectomy Weight:      124 pounds Pulse rate:   78 / minute BP sitting:   120 / 80  (left arm) Cuff size:   regular  Vitals Entered By: Romualdo Bolk, CMA (AAMA) (July 20, 2010 2:28 PM) CC: Meds not working for her. She states that her mood swings are up and down and feels like she is lossing her mind. Crying all the time., Depression. Pt states that she was afraid to leave the house. Under alot of stress.   History of Present Illness: Sarah Peterson cmes in as a SDA for above problem .perhaps depressive signs going on for about 6 months but worse recently . last week fe.lt suicidal like but not todfay.  stopped the zoloft and adderall for about 2-3 weeks ago as well as the adderall.   She has gotten more anxious since this time and hard to do anything and at times leave the house.  tried 1/2 .25 xanax left over from past . some help. NOt sleeping and is very stressed.  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   No longer taking narcotics.   Depression History:      The patient presents with symptoms of depression which have been present for greater than two weeks.  The patient is having a depressed mood most of the day and has a diminished interest in her usual daily activities.  Positive alarm features for depression include insomnia, psychomotor agitation, psychomotor retardation, fatigue (loss of energy), feelings of worthlessness (guilt), impaired concentration (indecisiveness), and recurrent thoughts of death or suicide.  However, she denies significant weight loss, significant weight gain, and hypersomnia.  Positive alarm features for a manic disorder include persistently & abnormally elevated mood, abnormally & persistently irritable mood, talkative or feels need to keep talking, distractibility, flight of ideas, increase in  goal-directed activity, psychomotor agitation, and inflated self-esteem or grandiosity.  She denies excessive buying sprees, excessive sexual indiscretions, and excessive foolish business investments.        Psychosocial stress factors include the recent death of a loved one and major life changes.  Risk factors for depression include a family history of depression, a personal history of depression, and a recent loss.  Suicide risk questions reveal that she does not feel like life is worth living, she wishes that she were dead, and she has thought about ending her life.  The patient denies that she has planned how to end her life.  Due to her current symptoms, it often takes extra effort to do the things she needs to do.        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 Mom was bi-polar.   Preventive Screening-Counseling & Management  Alcohol-Tobacco     Alcohol drinks/day: 2     Alcohol type: vodka     Smoking Status: never  Caffeine-Diet-Exercise     Caffeine use/day: 1     Does Patient Exercise: yes     Type of exercise: cardio and wts     Times/week: 7  Current Medications (verified): 1)  Proair Hfa 108 (90 Base) Mcg/act Aers (Albuterol Sulfate) .... Inhale 2 Puffs Every 4-6 Hours As Needed 2)  Xanax 0.25 Mg Tabs (Alprazolam) .... Take 1 Tablet By Mouth Once A Day As Needed 3)  Zoloft 100 Mg Tabs (Sertraline Hcl) .... Take 1/2 -1 By Mouth Once Daily 4)  Adderall Xr 20 Mg  Cp24 (Amphetamine-Dextroamphetamine) .Marland Kitchen.. 1 By Mouth Once Daily. 5)  Caltrate 600 1500 Mg Tabs (Calcium Carbonate) 6)  Vitamin D 1000 Unit Tabs (Cholecalciferol) 7)  Magnesium Oxide 250 Mg Tabs (Magnesium Oxide) 8)  Oxycodone Hcl 5 Mg Tabs (Oxycodone Hcl) .Marland Kitchen.. 1-2 Q 4-6 Hours As Needed  Allergies (verified): 1)  ! Augmentin (Amoxicillin-Pot Clavulanate)  Past History:  Past medical, surgical, family and social histories (including risk factors) reviewed, and no changes noted  (except as noted below).  Past Medical History: Reviewed history from 12/25/2009 and no changes required. Allergic rhinitis Asthma adhd   Hx mrsa skin  evaluated at [facility] for djd back and cspine   some foraminal stenosis  Past Surgical History: Reviewed history from 04/22/2008 and no changes required. Laparotomy-exploratory Hysterectomy    Past History:  Care Management: Orthopedics: Dr. Shon Baton Gynecology: Dr. Gerrit Heck: Manson Passey  Family History: Reviewed history from 12/25/2009 and no changes required. see record [place]  ADD Father died of aneurysm age 7 Mom CHF  Bipolar disease in MOM  Social History: Reviewed history from 04/09/2009 and no changes required. Married Never Smoked Regular exercise-yes    hhof 5?   husband works in Recruitment consultant.  Review of Systems       12 system review. no current  cv pulmonary issues  .. poor sleep   Physical Exam  General:  distressed tearful somwhat inc motor fidgeting in nad  coher ent and  ok eye contact  Head:  normocephalic and atraumatic.   Eyes:  clear  Neck:  No deformities, masses, or tenderness noted. Lungs:  normal respiratory effort, no intercostal retractions, and no accessory muscle use.   Heart:  normal rate and regular rhythm.   Extremities:  nl cap refill  Neurologic:  grossly non focal  no tremor incfrease fidgeting Skin:  turgor normal, color normal, no ecchymoses, and no petechiae.   Cervical Nodes:  No lymphadenopathy noted Psych:  omcrease motor acitiviy  no delusionsOriented X3, memory intact for recent and remote, normally interactive, not suicidal, tearful, and slightly anxious.     Impression & Recommendations:  Problem # 1:  DEPRESSION, ACUTE (ICD-296.23) seems to be getting worse and no significant  compliocated but sudden  stoppage of zoloft. Unclear  if has atypical  bipolar or  complicated issues.  counseled at length about  seeking psych med consult  doesnt wwish to go anywhere but here and tell her  story  however after  counseling convinced to get med check to help   her symptoms.    disc  rish and seeking emergent care.   rec  stop  her 2 martinis per night  in the face of this situation  Problem # 2:  ANXIETY STATE, UNSPECIFIED (ICD-300.00) related to above and stoppage of zoloft. not currently taking the adderall  Her updated medication list for this problem includes:    Xanax 0.25 Mg Tabs (Alprazolam) .Marland Kitchen... Take 1 tablet by mouth once a day as needed    Zoloft 100 Mg Tabs (Sertraline hcl) .Marland Kitchen... Take 1/2 -1 by mouth once daily    Mirtazapine 30 Mg Tabs (Mirtazapine) .Marland Kitchen... 1 by mouth at bedtime  Problem # 3:  ADHD (ICD-314.01) Assessment: Comment Only  Complete Medication List: 1)  Proair Hfa 108 (90 Base) Mcg/act Aers (Albuterol sulfate) .... Inhale 2 puffs every 4-6 hours as needed 2)  Xanax 0.25  Mg Tabs (Alprazolam) .... Take 1 tablet by mouth once a day as needed 3)  Zoloft 100 Mg Tabs (Sertraline hcl) .... Take 1/2 -1 by mouth once daily 4)  Adderall Xr 20 Mg Cp24 (Amphetamine-dextroamphetamine) .Marland Kitchen.. 1 by mouth once daily. currently not taking 5)  Caltrate 600 1500 Mg Tabs (Calcium carbonate) 6)  Vitamin D 1000 Unit Tabs (Cholecalciferol) 7)  Magnesium Oxide 250 Mg Tabs (Magnesium oxide) 8)  Mirtazapine 30 Mg Tabs (Mirtazapine) .Marland Kitchen.. 1 by mouth at bedtime  Patient Instructions: 1)  rec restart the zoloft at 50 mg per day to avoid withdrawal signs ( 1/2 ) o f 100 mg . 2)  Can also add remeron(for depression anxiety and may l make you sleepy  but doesnt give withdrawal.  3)  Advise medication consult  for reasons discussed. Dr Andee Poles or Dr Geoffery Lyons office. 4)  Call us when you know when appt is . 5)  Seek emergent care if needed in the meantime.  6)  [facility] 7)  go to [address]  8)  Go through [employer]  [phone] 9)  avoid alcohol at this time  Prescriptions: MIRTAZAPINE 30 MG TABS (MIRTAZAPINE) 1 by mouth at bedtime  #30 x 1    Entered and Authorized by:   Madelin Headings MD   Signed by:   Madelin Headings MD on 07/20/2010   Method used:   Electronically to        [facility] (retail)       [address]       [place], [place]  [address]       Ph: [phone] or [phone]       Fax: [phone]   RxID:   [id number]   will call and plan follow up .   Orders Added: 1)  Est. Patient Level V [44034]   greater than 50% of visit spent in counseling assessment  45 minutes

## 2010-09-05 ENCOUNTER — Encounter: Payer: Self-pay | Admitting: Internal Medicine

## 2010-09-07 ENCOUNTER — Telehealth: Payer: Self-pay | Admitting: *Deleted

## 2010-09-07 NOTE — Telephone Encounter (Signed)
Pt has been seen by Dr. Dub Mikes. He is going to rx her psych meds. Then we will do the other medications.

## 2010-09-09 ENCOUNTER — Ambulatory Visit: Payer: Self-pay | Admitting: Internal Medicine

## 2010-09-20 LAB — URINALYSIS, ROUTINE W REFLEX MICROSCOPIC
Bilirubin Urine: NEGATIVE
Glucose, UA: NEGATIVE mg/dL
Hgb urine dipstick: NEGATIVE
Specific Gravity, Urine: 1.029 (ref 1.005–1.030)

## 2010-09-20 LAB — POCT CARDIAC MARKERS
CKMB, poc: 1.2 ng/mL (ref 1.0–8.0)
Myoglobin, poc: 107 ng/mL (ref 12–200)
Myoglobin, poc: 45.5 ng/mL (ref 12–200)
Myoglobin, poc: 48 ng/mL (ref 12–200)
Troponin i, poc: <0.05 ng/mL (ref 0.00–0.09)
Troponin i, poc: <0.05 ng/mL (ref 0.00–0.09)

## 2010-09-20 LAB — DIFFERENTIAL
Basophils Absolute: 0 10*3/uL (ref 0.0–0.1)
Lymphocytes Relative: 28 % (ref 12–46)
Lymphs Abs: 1.4 10*3/uL (ref 0.7–4.0)
Monocytes Absolute: 0.4 10*3/uL (ref 0.1–1.0)
Neutro Abs: 3.1 10*3/uL (ref 1.7–7.7)

## 2010-09-20 LAB — CBC
MCH: 32.3 pg (ref 26.0–34.0)
MCHC: 34.7 g/dL (ref 30.0–36.0)
MCV: 93 fL (ref 78.0–100.0)
Platelets: 180 10*3/uL (ref 150–400)
RDW: 13 % (ref 11.5–15.5)

## 2010-09-20 LAB — COMPREHENSIVE METABOLIC PANEL
Albumin: 3.9 g/dL (ref 3.5–5.2)
BUN: 13 mg/dL (ref 6–23)
Calcium: 9.1 mg/dL (ref 8.4–10.5)
Chloride: 106 mEq/L (ref 96–112)
Creatinine, Ser: 0.75 mg/dL (ref 0.4–1.2)
GFR calc Af Amer: >60 mL/min (ref >60)
Total Bilirubin: 0.8 mg/dL (ref 0.3–1.2)
Total Protein: 6.8 g/dL (ref 6.0–8.3)

## 2010-09-20 LAB — LIPASE, BLOOD: Lipase: 41 U/L (ref 11–59)

## 2010-09-24 ENCOUNTER — Encounter: Payer: Self-pay | Admitting: Internal Medicine

## 2010-09-24 ENCOUNTER — Ambulatory Visit (INDEPENDENT_AMBULATORY_CARE_PROVIDER_SITE_OTHER): Payer: PRIVATE HEALTH INSURANCE | Admitting: Internal Medicine

## 2010-09-24 VITALS — BP 130/80 | HR 72 | Wt 127.0 lb

## 2010-09-24 DIAGNOSIS — F909 Attention-deficit hyperactivity disorder, unspecified type: Secondary | ICD-10-CM

## 2010-09-24 DIAGNOSIS — R030 Elevated blood-pressure reading, without diagnosis of hypertension: Secondary | ICD-10-CM

## 2010-09-24 DIAGNOSIS — S83519A Sprain of anterior cruciate ligament of unspecified knee, initial encounter: Secondary | ICD-10-CM | POA: Insufficient documentation

## 2010-09-24 DIAGNOSIS — J45909 Unspecified asthma, uncomplicated: Secondary | ICD-10-CM

## 2010-09-24 DIAGNOSIS — F411 Generalized anxiety disorder: Secondary | ICD-10-CM

## 2010-09-24 DIAGNOSIS — S83509A Sprain of unspecified cruciate ligament of unspecified knee, initial encounter: Secondary | ICD-10-CM

## 2010-09-24 MED ORDER — AMPHETAMINE-DEXTROAMPHET ER 20 MG PO CP24: 20.0000 mg | ORAL_CAPSULE | ORAL | Status: DC

## 2010-09-24 NOTE — Progress Notes (Signed)
  Subjective:    Patient ID: Sarah Peterson, female    DOB: 1961/05/17, 50 y.o.   MRN: [id number]  HPI  patient comes in today for follow up and medication check. Since her last visit in December she has seen Dr. Dub Mikes psychiatrist and has been placed on Lamictal beginning 25 mg up to 50 mg maximum  75 mg. She is maintaining on the Zoloft 50 mg a day. She still has mood issues but is much better and not as much ups and downs. She is to follow up with him monthly. She comes in to refill her ADD medication that he feels she should stay on no new side effects of that.   In the interim should any injury last week to her left knee which is just been diagnosed as a probable anterior cruciate ligament tear. This concerns her because she uses vigorous exercise as a form of  Relieving stress. She uses treadmill elliptical other machines.   She is to follow up with the orthopedist soon.   Review of Systems  no chest pain shortness of breath her asthma is stable no new skin lesions no neuro problems. Sleep is up and down had extreme sedation with Remeron. Her blood pressure has been stable. Rest of ROS negative or noncontributory    Objective:   Physical Exam  well-developed well-nourished in no acute distress some what increased motor activity and anxiety normal cognition she has a brace on her left knee. HEENT: Normocephalic ;atraumatic , Eyes;  PERRL, EOMs  Full, lids and conjunctiva clear,,Ears: no deformities, canals nl, TM landmarks normal, Nose: no deformity or discharge  Mouth : OP clear without lesion or edema . Neck wnl Chest:  Clear to A&P without wheezes rales or rhonchi CV:  S1-S2 no gallops or murmurs peripheral perfusion is normal Abdomen:  Sof,t normal bowel sounds without hepatosplenomegaly, no guarding rebound or masses no CVA tenderness Psych nl eye contact mil anxiety nl speech and cognition no tremor  Neuro non focal   extr left knee in brace walking nl      Assessment & Plan:    ADHD  No sig se of meds  bp ok today   Depression anxiety  Poss bipolar depression  Better  On lamictal with zoloft  So far.  Disc use of cbt in the future for specific issues that may be Problematic. Currently not interested in counseling  discussed provider medicine management. At this time I advised  it would be best per Dr. Dub Mikes to manage the psychiatric ADD medications at this time.    We gave prescription for 90 day Adderall today. But would prefer psychiatry to do further medicine management until further notice. At some time in the future we can consider prescribing when things are more improved and stable.   anterior cruciate ligament injury left   This is problematic  And making her anxious todaybecause she uses exercise as therapy. We discussed at length cross training potential course and expected management. I think she should stay as active as she can without hurting her knee and she will probably do well.  asthma is stable  she is due for a checkup soon within 4-6 months she should schedule for full labs and a checkup. Call in  meantime if there is a problem.

## 2010-09-24 NOTE — Patient Instructions (Addendum)
For next refill lets have Dr.  Dub Mikes manage medications for mood and adhd  At present. Ask ortho about  Exercises you can do in the convalescing  phase.

## 2010-09-29 ENCOUNTER — Ambulatory Visit: Payer: PRIVATE HEALTH INSURANCE

## 2010-11-02 ENCOUNTER — Other Ambulatory Visit: Payer: Self-pay | Admitting: Dermatology

## 2010-11-17 NOTE — Assessment & Plan Note (Signed)
[facility]                                 ON-CALL NOTE   NAME:Giovanetti, ILETA OFARRELL                    MRN:          [id number]  DATE:03/23/2007                            DOB:          August 14, 1960    DATE OF CALL:  March 23, 2007.  Call was received at 8:06 A.M.  This  is a telephone triage note.  The patient's  name is Sarah Peterson, the  caller is the same.  She sees Dr. Fabian Sharp.  Telephone [phone].  Date of  birth 04-14-1961.  Patient reports four days of stomach cramps  and nausea.  She said her entire family has had a stomach virus.  Most  everyone else has gotten over it but she has not.  There has been no  diarrhea, no fever.  She has vomited on a couple of occasions but her  main complaint is persistent abdominal cramps.  My response is the  clinic will be open 15 minutes from now.  I will relay this  message to  Dr. Fabian Sharp and have her nurse call her right back.  I suspect she will  need to be seen in the clinic today.     Tera Mater. Clent Ridges, MD  Electronically Signed    SAF/MedQ  DD: 03/23/2007  DT: 03/23/2007  Job #: [id number]

## 2010-11-20 NOTE — H&P (Signed)
NAME:  Sarah, Peterson NO.:  [id number]   MEDICAL RECORD NO.:  [id number]          PATIENT TYPE:  AMB   LOCATION:  SDC                           FACILITY:  [facility]   PHYSICIAN:  Freddy Finner, M.D.   DATE OF BIRTH:  [birth year]   DATE OF ADMISSION:  05/26/2004  DATE OF DISCHARGE:                                HISTORY & PHYSICAL   ADMISSION DIAGNOSES:  Uterine enlargement probably secondary to adenomyosis.   PREVIOUS SURGICAL HISTORY:  Endometriosis.   CHRONIC SYMPTOMS:  Menorrhagia, dysmenorrhea.   The patient is a 50 year old white married female , gravida 3, para 4 who  has a long history of menorrhagia and chronic dysmenorrhea.  Options of  therapy have been discussed with her including use of Lupron versus  endometrial ablation for menorrhagia. She has requested more definitive  surgery and is now admitted for laparoscopically assisted vaginal  hysterectomy, possible bilateral salpingo-oophorectomy.   REVIEW OF SYMPTOMS:  Otherwise negative, there are no cardiopulmonary, GI or  GU complaints. 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   She has no other known significant medical illnesses.   PAST SURGICAL HISTORY:  1.  Laparoscopy.  2.  Colposcopy.  3.  She has had vaginal births on three different occasions.  4.  She has never had a blood transfusion.   ALLERGIES:  She has no known allergies to medications.   CURRENT MEDICATIONS:  Her only current ongoing medications are Zoloft 100 mg  q.d., vitamins and glucosamine.  She does not use cigarettes. She does not  use alcohol.   FAMILY HISTORY:  Noncontributory.   PHYSICAL EXAMINATION:  HEENT:  Grossly within normal limits.  Thyroid gland  is not palpably enlarged.  VITAL SIGNS:  Blood pressure in the office is 118/70.  CHEST:  Clear to auscultation throughout.  HEART:  Sinus rhythm without murmurs, rubs or gallops.  BREASTS:  Normal.   No palpable masses, no nipple discharge, no skin change.  ABDOMEN:  Soft and nontender without appreciable organomegaly or palpable  masses.  PELVIC:  External genitalia, vagina and cervix are normal to inspection.  Bimanual reveals the uterus to be slightly enlarged and anterior in  position. There are no palpable adnexal masses.  RECTUM:  Palpably normal.  Rectovaginal exam confirms these findings.  EXTREMITIES:  Without cyanosis, clubbing or edema.   Sonohysterogram in the office showed a normal endometrial cavity with  thickening of the anterior uterine wall.   ASSESSMENT:  Suspected adenomyosis, probable recurrent endometriosis.   PLAN:  Laparoscopically assisted vaginal hysterectomy, possible bilateral  salpingo-oophorectomy.     Hosie Spangle   WRN/MEDQ  D:  05/25/2004  T:  05/25/2004  Job:  [id number]

## 2010-11-20 NOTE — Op Note (Signed)
NAME:  Sarah, Peterson NO.:  [id number]   MEDICAL RECORD NO.:  [id number]          PATIENT TYPE:  OBV   LOCATION:  9318                          FACILITY:  [facility]   PHYSICIAN:  Freddy Finner, M.D.   DATE OF BIRTH:  Jan 01, 1961   DATE OF PROCEDURE:  05/26/2004  DATE OF DISCHARGE:                                 OPERATIVE REPORT   PREOPERATIVE DIAGNOSES:  1.  Uterine enlargement.  2.  Menorrhagia.  3.  History consistent with pelvic endometriosis.   POSTOPERATIVE DIAGNOSES:  1.  Uterine enlargement.  2.  Pelvic adhesions.  3.  Normal fallopian tubes and ovaries.   OPERATION:  Laparoscopically assisted vaginal hysterectomy, lysis of pelvic  adhesions.   ANESTHESIA:  General endotracheal.   COMPLICATIONS:  None.   ESTIMATED BLOOD LOSS:  150 mL.   Details of the present illness are recorded in the admission note. The  patient was admitted on the morning of surgery. She was given a bolus of  antibiotic IV preoperatively. She was placed in PAS hose. She was brought to  the operating room, placed under adequate general anesthesia, placed in the  dorsal lithotomy position.  Betadine prep was carried out using Betadine  scrub followed by Betadine solution. This included the abdomen, perineum and  vagina. The bladder was evacuated using sterile technique and a Robinson  catheter.  A Hulka tenaculum was attached to the cervix under direct  visualization. Sterile drapes were applied. Two small incisions were then  made, one at the umbilicus through an old scar and one just above the  symphysis. An 11 mm nonbladed disposable trocar was introduced at the  umbilicus while elevating the anterior abdominal wall manually. Direct  inspection with the laparoscope revealed adequate placement within the  abdominal cavity but no evidence of injury on entry.  Pneumoperitoneum was  allowed to accumulate with carbon dioxide gas. A 5 mm trocar was placed  through the lower incision  under direct visualization.  Systematic  examination of pelvic and abdominal cavity was carried out. There was no  apparent abnormality in the upper abdomen. The liver appeared to be normal.  The appendix was visualized and was normal.  On examination of the pelvic  contents, the uterus itself was boggy and enlarged and somewhat irregular.  The right fallopian tube and ovary were normal. There was a follicular type  cyst. The left fallopian tube and ovary were normal.  There were adhesions  from the rectum to the posterior cervix and the cul-de-sac also adhesions of  the epiploica of the sigmoid to the cul-de-sac and to the left pelvic  sidewall.  Using the gyrus tripolar device, the adhesions in the cul-de-sac  were lysed. There were adhesions of the sigmoid colon to the left pelvic  sidewall not encompassing the ovary and for that reason they were left in  place.  A blunt problem was used through the lower trocar sleeve for  traction during the procedure. The uteroovarian ligament, round ligament,  upper broad ligament was progressively grasped, sealed and divided using the  gyrus tripolar device.  This was  carried to a level just above the uterine  arteries. This was accomplished on the left side in a similar fashion.  Attention was then turned vaginally. The posterior weighted vaginal  retractor was placed. The cervix was visualized with Deaver retractors. The  cervix was grasped with Christella Hartigan tenaculum and the Hulka tenaculum removed.  Colpotomy incision was made while tenting the mucosa posterior to the  cervix. The cervix was circumscribed to the scalpel to release the mucosa.  The gyrus Heaney style clamp was then applied to the uterosacral's on each  side, they were sealed and divided. Bladder pillars were taken separately.  They too were sealed and divided.  The bladder was carefully advanced off  the cervix and anterior peritoneum entered. The cardinal ligament pedicles  and then  vessel pedicles on each side were taken, sealed and divided. The  uterus was then delivered through the vaginal introitus without difficulty.  Angles of the vagina were then anchored to the uterosacral's with mattress  sutures of #0 Monocryl's. The uterosacral's were plicated and posterior  peritoneum closed with an interrupted #0 Monocryl suture. The cuff was  closed vertically with figure-of-eights of #0 Monocryl.  The Foley catheter  was placed. Reinspection laparoscopically was carried out. The Nezhat  irrigation system was used and careful examination of all dissected pedicles  and surfaces was carried out. Minimal bleeding was identified on the bladder  reflection or flap and this was controlled with the bipolar coagulation  device. The irrigating solution is aspirated from the abdomen, inspection  under reduced intraabdominal pressure confirmed hemostasis.  All instruments  removed, gas was allowed to escape from the abdomen. The skin incisions were  closed with interrupted subcuticular sutures of 3-0 Dexon.  Marcaine 0.5%  plain was injected into the incision sites for postoperative analgesia.  Steri-Strips were applied to the lower incision. Sterile dressing to the  upper incision. The patient was awakened, taken to the recovery room in good  condition.     Hosie Spangle   WRN/MEDQ  D:  05/26/2004  T:  05/26/2004  Job:  [id number]

## 2010-11-20 NOTE — Discharge Summary (Signed)
Sarah Peterson, Sarah Peterson NO.:  [id number]   MEDICAL RECORD NO.:  [id number]          PATIENT TYPE:  OBV   LOCATION:  9318                          FACILITY:  [facility]   PHYSICIAN:  Freddy Finner, M.D.   DATE OF BIRTH:  May 14, 1961   DATE OF ADMISSION:  05/26/2004  DATE OF DISCHARGE:  05/27/2004                                 DISCHARGE SUMMARY   DISCHARGE DIAGNOSES:  1.  Uterine adenomyosis.  2.  Uterine serosal endometriosis.  3.  Pelvic adhesions.   OPERATIVE PROCEDURE:  Laparoscopic-assisted vaginal hysterectomy, lysis of  pelvic adhesions.   OPERATIVE AND POSTOPERATIVE COMPLICATIONS:  None.   DISPOSITION:  The patient was in satisfactory and improved condition at the  time of her discharge on postoperative day #1.  She was discharged home with  routine instructions; specifically, to avoid heavy lifting or vaginal entry;  to call for fever, for severe pain, or heavy vaginal bleeding.  She was  given Percocet 5/325 to be taken one or two q.4h. as needed for  postoperative pain.  She was given Zofran oral dissolving tablets 4 mg for  nausea control.  She is to return to the office in 2 weeks for her first  postoperative visit.  She is to take a regular diet.   Details of the present illness, past history, family history, review of  systems, and physical exam are recorded in the admission note and/or in the  operative summary.  Physical findings on admission were primarily remarkable  for slight enlargement of the uterus but otherwise normal.   Laboratory data is not available in the chart at time of this dictation.   The patient was admitted on the morning of surgery.  A laparoscopic-assisted  vaginal hysterectomy was performed without difficulty, as well as lysis of  pelvic adhesions.  The patient's postoperative course was entirely  satisfactory.  There were no significant intraoperative or postoperative  complications.  By postoperative day #1 she was  tolerating a regular diet,  she was ambulating without difficulty, she was having adequate bowel and  bladder function.  She was discharged home with disposition as noted above.     Hosie Spangle   WRN/MEDQ  D:  07/09/2004  T:  07/09/2004  Job:  [id number]

## 2010-11-20 NOTE — Op Note (Signed)
NAME:  Sarah Peterson, RITCHEY NO.:  [id number]   MEDICAL RECORD NO.:  [id number]          PATIENT TYPE:  AMB   LOCATION:  DSC                          FACILITY:  [facility]   PHYSICIAN:  Alfredia Ferguson, M.D.  DATE OF BIRTH:  1961-06-12   DATE OF PROCEDURE:  03/03/2005  DATE OF DISCHARGE:                                 OPERATIVE REPORT   PREOPERATIVE DIAGNOSIS:  Recurrent basal cell carcinoma right anterior  thigh.   POSTOPERATIVE DIAGNOSIS:  Recurrent basal cell carcinoma right anterior  thigh.   OPERATION PERFORMED:  Excision of recurrent basal cell carcinoma right  anterior thigh with approximately 3 mm margins.  Recurrent lesion is  approximately  1.3 cm in diameter.   SURGEON:  Alfredia Ferguson, M.D.   ANESTHESIA:  2% Xylocaine, 1:100,00 epinephrine.   INDICATIONS FOR SURGERY:  This is a 50 year old woman who has had biopsy-  proven recurrent basal cell carcinoma on the right anterior thigh.  The  patient has some nodular changes and some surface changes of the scar.  My  plan is to do a fairly aggressive resection removing several millimeter  margin.  The risks of unsightly scarring were discussed with the patient.  She understands also that there is a risk of recurrence.  In spite of that,  the patient wishes to proceed with the surgery.   DESCRIPTION OF OPERATION:  Skin markers were placed around the scar and the  nodular recurrence.  The width of the excision is approximately 2 cm.  The  local anesthesia was infiltrated and the area was prepped with Betadine and  draped with sterile drapes.  The local anesthesia that was used with 2%  Xylocaine, 1:100,000 epinephrine.  After waiting 10 minutes, an elliptical  excision of the lesion was carried out down to the level of the subcutaneous  tissue.  Specimen was submitted for pathology.  Wound edges were undermined  for a distance of several millimeters in all directions.  The wound was  closed and approximated  the dermis with multiple interrupted 4-0 Monocryl  for the dermis.  A running 4-0 Monocryl was then placed in the  intracuticular area.  Interrupted horizontal mattresses of 4-0 nylon were  then placed to secure the closure because of some tension.  The area was  cleansed and dried, and light dressing was applied.      Alfredia Ferguson, M.D.  Electronically Signed     WBB/MEDQ  D:  03/03/2005  T:  03/03/2005  Job:  [id number]

## 2010-12-03 ENCOUNTER — Other Ambulatory Visit: Payer: Self-pay | Admitting: Dermatology

## 2011-02-17 ENCOUNTER — Other Ambulatory Visit: Payer: Self-pay | Admitting: Internal Medicine

## 2011-02-18 NOTE — Telephone Encounter (Signed)
Ok to refill inhaler medication x 3 Have her schedule cpx with labs before she runs out of med

## 2011-02-18 NOTE — Telephone Encounter (Signed)
LOV 09/24/10 NOV 03/29/11

## 2011-03-29 ENCOUNTER — Encounter: Payer: Self-pay | Admitting: Internal Medicine

## 2011-03-29 ENCOUNTER — Ambulatory Visit (INDEPENDENT_AMBULATORY_CARE_PROVIDER_SITE_OTHER): Payer: PRIVATE HEALTH INSURANCE | Admitting: Internal Medicine

## 2011-03-29 VITALS — BP 120/80 | HR 66 | Ht 63.75 in | Wt 128.0 lb

## 2011-03-29 DIAGNOSIS — Z23 Encounter for immunization: Secondary | ICD-10-CM

## 2011-03-29 DIAGNOSIS — J309 Allergic rhinitis, unspecified: Secondary | ICD-10-CM

## 2011-03-29 DIAGNOSIS — M5030 Other cervical disc degeneration, unspecified cervical region: Secondary | ICD-10-CM

## 2011-03-29 DIAGNOSIS — J45909 Unspecified asthma, uncomplicated: Secondary | ICD-10-CM

## 2011-03-29 DIAGNOSIS — Z0000 Encounter for general adult medical examination without abnormal findings: Secondary | ICD-10-CM

## 2011-03-29 DIAGNOSIS — F909 Attention-deficit hyperactivity disorder, unspecified type: Secondary | ICD-10-CM

## 2011-03-29 MED ORDER — BECLOMETHASONE DIPROPIONATE 40 MCG/ACT IN AERS: 2.0000 | INHALATION_SPRAY | Freq: Two times a day (BID) | RESPIRATORY_TRACT | Status: DC

## 2011-03-29 NOTE — Progress Notes (Signed)
  Subjective:    Patient ID: Sarah Peterson, female    DOB: 10-04-1960, 50 y.o.   MRN: [id number]  HPI Patient comes in today for preventive visit and follow-up of medical issues. Update of her history since her last visit. She has been doing pretty well :  Asthma:   Acting up some fall.  Dust with  [employer].  Using prn albuterol  About once a day.  1 puff. Sometimes sob in am  Not on  Controller med so far last time q var in 2009 DJD spine  Thumb    Still  Exercising.  MOOD: adhd anxiety: per Dr Dub Mikes and seeing him about every 3+ months  Stable meds .   Xanax rx is expired  Would like some on hand  In case Review of Systems ROS:  GEN/ HEENTNo fever, significant weight changes sweats headaches vision problems hearing changes, CV/ PULM; No chest pain shortness of breath cough, syncope,edema  change in exercise tolerance. GI /GU: No adominal pain, vomiting, change in bowel habits. No blood in the stool. No significant GU symptoms. SKIN/HEME: ,no acute skin rashes suspicious lesions or bleeding. No lymphadenopathy, nodules, masses.  NEURO/ PSYCH:  No neurologic signs such as weakness numbness No new depression anxiety. IMM/ Allergy: No unusual infections.  Allergy .   As above  REST of 12 system review negative      Objective:   Physical Exam Physical Exam: Vital signs reviewed WUJ:WJXB is a well-developed well-nourished alert cooperative  white female who appears her stated age in no acute distress.  HEENT: normocephalic  traumatic , Eyes: PERRL EOM's full, conjunctiva clear, Nares: paten,t no deformity discharge or tenderness., Ears: no deformity EAC's clear TMs with normal landmarks. Mouth: clear OP, no lesions, edema.  Moist mucous membranes. Dentition in adequate repair. NECK: supple without masses, thyromegaly or bruits. CHEST/PULM:  Clear to auscultation and percussion breath sounds equal no wheeze , rales or rhonchi. No chest wall deformities or tenderness. Breast:  normal by inspection . No dimpling, discharge, masses, tenderness or discharge . LN: no cervical axillary inguinal adenopathy  CV: PMI is nondisplaced, S1 S2 no gallops, murmurs, rubs. Peripheral pulses are full without delay.No JVD .  ABDOMEN: Bowel sounds normal nontender  No guard or rebound, no hepato splenomegal no CVA tenderness.  No hernia. Extremtities:  No clubbing cyanosis or edema, no acute joint swelling or redness no focal atrophy NEURO:  Oriented x3, cranial nerves 3-12 appear to be intact, no obvious focal weakness,gait within normal limits no abnormal reflexes or asymmetrical SKIN: No acute rashes normal turgor, color, no bruising or petechiae. PSYCH: Oriented, good eye contact, no obvious depression anxiety, cognition and judgment appear normal.  Labs from Dr Jennette Kettle reviewed from 2011 and dexa was good.      Assessment & Plan:  Preventive Health Care Counseled regarding healthy nutrition, exercise, sleep, injury prevention, calcium vit d and healthy weight . Flu shot and tdap today  Can get labs next year  UTD on other parameters. Adhd Depressive rx  Anxiety   Have  Dr Dub Mikes do these meds .  Asthma  Some increase use of rescue recently for extrinsic reasons  Go back on controller med  q var 40  2 bid. Again. During season and call if getting worse MS  DJD controlled  Exercising

## 2011-03-29 NOTE — Patient Instructions (Signed)
Begin controller med for asthma in this season.   Have Dr Dub Mikes rx the xanax. Will notify you  of labs when available. Check up in a year.

## 2011-06-18 ENCOUNTER — Other Ambulatory Visit: Payer: Self-pay | Admitting: Dermatology

## 2012-04-19 ENCOUNTER — Ambulatory Visit (INDEPENDENT_AMBULATORY_CARE_PROVIDER_SITE_OTHER): Payer: PRIVATE HEALTH INSURANCE

## 2012-04-19 DIAGNOSIS — Z23 Encounter for immunization: Secondary | ICD-10-CM

## 2012-09-15 ENCOUNTER — Encounter: Payer: Self-pay | Admitting: Internal Medicine

## 2012-09-15 ENCOUNTER — Ambulatory Visit (INDEPENDENT_AMBULATORY_CARE_PROVIDER_SITE_OTHER): Payer: PRIVATE HEALTH INSURANCE | Admitting: Internal Medicine

## 2012-09-15 VITALS — BP 116/80 | HR 77 | Temp 99.3°F | Wt 137.0 lb

## 2012-09-15 DIAGNOSIS — R6889 Other general symptoms and signs: Secondary | ICD-10-CM

## 2012-09-15 DIAGNOSIS — F909 Attention-deficit hyperactivity disorder, unspecified type: Secondary | ICD-10-CM

## 2012-09-15 DIAGNOSIS — R635 Abnormal weight gain: Secondary | ICD-10-CM | POA: Insufficient documentation

## 2012-09-15 DIAGNOSIS — J45909 Unspecified asthma, uncomplicated: Secondary | ICD-10-CM

## 2012-09-15 LAB — CBC WITH DIFFERENTIAL/PLATELET
Basophils Absolute: 0 10*3/uL (ref 0.0–0.1)
Basophils Relative: 1.1 % (ref 0.0–3.0)
Eosinophils Relative: 5.7 % — ABNORMAL HIGH (ref 0.0–5.0)
Hemoglobin: 14.5 g/dL (ref 12.0–15.0)
Lymphocytes Relative: 35.4 % (ref 12.0–46.0)
Monocytes Relative: 11.8 % (ref 3.0–12.0)
Neutro Abs: 1.7 10*3/uL (ref 1.4–7.7)
RBC: 4.7 Mil/uL (ref 3.87–5.11)
WBC: 3.7 10*3/uL — ABNORMAL LOW (ref 4.5–10.5)

## 2012-09-15 LAB — HEPATIC FUNCTION PANEL
AST: 28 U/L (ref 0–37)
Albumin: 4 g/dL (ref 3.5–5.2)
Alkaline Phosphatase: 73 U/L (ref 39–117)
Total Protein: 7.1 g/dL (ref 6.0–8.3)

## 2012-09-15 LAB — BASIC METABOLIC PANEL
Calcium: 9.2 mg/dL (ref 8.4–10.5)
GFR: 66.95 mL/min (ref >60.00)
Sodium: 141 mEq/L (ref 135–145)

## 2012-09-15 NOTE — Progress Notes (Signed)
Chief Complaint  Patient presents with  . Weight Gain    HPI:  Patient comes in today  for  new problem evaluation. Cold  Hands  Heavy  covers   Hot flushes  Going.  Still works out   But now working  Corporate treasurer   At [employer]   .   But a change    [employer] office .  Sees her for psychiatric management. Habits no tobacco regular exercise at the gym 1 cup coffee 2 martinis   Per day.  ROS: See pertinent positives and negatives per HPI. No chest pain shortness of breath asthma flare. Major change in health. Her activity level is a bit different no unusual bleeding.  Past Medical History  Diagnosis Date  . Allergy   . Asthma   . ADHD (attention deficit hyperactivity disorder)   . MRSA (methicillin resistant staph aureus) culture positive   . 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 ACL injury tear     Seeing Dr. Ranell Patrick    Family History  Problem Relation Age of Onset  . Heart failure Mother   . Bipolar disorder Mother   . Aneurysm Father     History   Social History  . Marital Status: Married    Spouse Name: N/A    Number of Children: N/A  . Years of Education: N/A   Social History Main Topics  . Smoking status: Former Games developer  . Smokeless tobacco: None  . Alcohol Use: 7.0 oz/week    14 drink(s) per week  . Drug Use: No  . Sexually Active: None   Other Topics Concern  . None   Social History Narrative   Married    Now separated      Regular exercise   Lives  At [facility] .       Teens at home.  Some challenges              Outpatient Encounter Prescriptions as of 09/15/2012  Medication Sig Dispense Refill  . ABILIFY 2 MG tablet       . albuterol (PROAIR HFA) 108 (90 BASE) MCG/ACT inhaler Inhale two puffs every 4-6 hours as needed. Pt needs to call office to schedule a physical before next refill.  1 Inhaler  2  . ALPRAZolam (XANAX) 0.25 MG tablet Take 0.25 mg by mouth at bedtime as  needed.       Marland Kitchen amphetamine-dextroamphetamine (ADDERALL) 20 MG tablet Take 20 mg by mouth 2 (two) times daily.        . Estradiol (ELESTRIN) 0.52 MG/0.87 GM (0.06%) GEL Place onto the skin.        Marland Kitchen lamoTRIgine (LAMICTAL) 25 MG tablet Take 25 mg by mouth daily. Taking 50mg  and increasing      . sertraline (ZOLOFT) 100 MG tablet Take 100 mg by mouth daily. 1/2 to 1 daily       . [DISCONTINUED] beclomethasone (QVAR) 40 MCG/ACT inhaler Inhale 2 puffs into the lungs 2 (two) times daily.  1 Inhaler  12  . [DISCONTINUED] Calcium-Vitamin D-Vitamin K 500-500-40 MG-UNT-MCG CHEW Chew by mouth.        . [DISCONTINUED] iron polysaccharides (POLY-IRON 150) 150 MG capsule Take 150 mg by mouth 2 (two) times daily.        . [DISCONTINUED] magnesium 30 MG tablet Take 30 mg by mouth 2 (two) times daily.        . [  DISCONTINUED] vitamin E 1000 UNIT capsule Take 1,000 Units by mouth daily.         No facility-administered encounter medications on file as of 09/15/2012.    EXAM:  BP 116/80  Pulse 77  Temp(Src) 99.3 F (37.4 C) (Oral)  Wt 137 lb (62.143 kg)  BMI 23.71 kg/m2  SpO2 98%  Body mass index is 23.71 kg/(m^2). Wt Readings from Last 3 Encounters:  09/15/12 137 lb (62.143 kg)  03/29/11 128 lb (58.06 kg)  09/24/10 127 lb (57.607 kg)     GENERAL: vitals reviewed and listed above, alert, oriented, appears well hydrated and in no acute distress  HEENT: atraumatic, conjunctiva  clear, no obvious abnormalities on inspection of external nose and ears OP : no lesion edema or exudate   NECK: no obvious masses on inspection palpation no bruits  LUNGS: clear to auscultation bilaterally, no wheezes, rales or rhonchi, good air movement  CV: HRRR, no clubbing cyanosis or  peripheral edema nl cap refill  Abdomen soft without organomegaly guarding or rebound MS: moves all extremities without noticeable focal  abnormality  PSYCH: pleasant and cooperative,  normal cognition bit stressed when speaking of  social situation but appropriate or discussion.  ASSESSMENT AND PLAN:  Discussed the following assessment and plan:  Weight gain finding - Plan: ABILIFY 2 MG tablet, Basic metabolic panel, CBC with Differential, Hepatic function panel, TSH, T4, free, Ferritin  Cold intolerance of hand - Plan: ABILIFY 2 MG tablet, Basic metabolic panel, CBC with Differential, Hepatic function panel, TSH, T4, free, Ferritin  ADHD  ASTHMA It is certainly possible combination of lifestyle change activity and medications could contribute rule out metabolic she is also. Menopausal. Discussed strategies. Overall she has a pretty healthy lifestyle as far as diet and exercise. Reminded about caloric intake of alcohol also. -Patient advised to return or notify health care team  if symptoms worsen or persist or new concerns arise.  Patient Instructions  Continue lifestyle intervention healthy eating and exercise . Tracking is a good tactic.  I suspect the weight gain is from change in life patterns. We'll check for thyroid anemia etc.  Check on preventive colonoscopy   And contact us for a referral if needed.     Neta Mends. Ivadell Gaul M.D.

## 2012-09-15 NOTE — Patient Instructions (Addendum)
Continue lifestyle intervention healthy eating and exercise . Tracking is a good tactic.  I suspect the weight gain is from change in life patterns. We'll check for thyroid anemia etc.  Check on preventive colonoscopy   And contact us for a referral if needed.

## 2012-12-29 ENCOUNTER — Telehealth: Payer: Self-pay | Admitting: Internal Medicine

## 2012-12-29 NOTE — Telephone Encounter (Signed)
Pt would like to have the cortisoil lab test for her weight gain. Pt states she has read about this test and is still having isues w/ weight gain.

## 2013-01-01 NOTE — Telephone Encounter (Signed)
There are different tests  Am cortisol and the 24 hour urine free cortisol which is a better test.  May be better to see endocrinologist .  Please have her  Opinion about path she wishes to go.

## 2013-01-02 NOTE — Telephone Encounter (Signed)
Left message at below listed number for the pt to return my call. 

## 2013-01-03 ENCOUNTER — Other Ambulatory Visit: Payer: Self-pay | Admitting: Family Medicine

## 2013-01-03 DIAGNOSIS — R635 Abnormal weight gain: Secondary | ICD-10-CM

## 2013-01-03 NOTE — Telephone Encounter (Signed)
Patient decided to go with endo. Referral placed in the system.

## 2013-01-26 ENCOUNTER — Ambulatory Visit: Payer: PRIVATE HEALTH INSURANCE | Admitting: Endocrinology

## 2013-01-29 ENCOUNTER — Encounter: Payer: Self-pay | Admitting: Gastroenterology

## 2013-02-20 ENCOUNTER — Encounter: Payer: Self-pay | Admitting: Endocrinology

## 2013-02-20 ENCOUNTER — Ambulatory Visit (INDEPENDENT_AMBULATORY_CARE_PROVIDER_SITE_OTHER): Payer: PRIVATE HEALTH INSURANCE | Admitting: Endocrinology

## 2013-02-20 VITALS — BP 126/80 | HR 90 | Ht 64.0 in | Wt 139.0 lb

## 2013-02-20 DIAGNOSIS — R635 Abnormal weight gain: Secondary | ICD-10-CM

## 2013-02-20 MED ORDER — DEXAMETHASONE 1 MG PO TABS: ORAL_TABLET | ORAL | Status: DC

## 2013-02-20 NOTE — Patient Instructions (Addendum)
Let's check the 24-hr urine test.  Please pick up a jug downstairs. Also, you should do a "dexamethasone suppression test."  for this, you would take dexamethasone 1 mg at 10 pm, then come in for a "cortisol" blood test the next morning before 9 am.  you do not need to be fasting for this test.  We'll contact you with each of your test esults.

## 2013-02-20 NOTE — Progress Notes (Signed)
Subjective:    Patient ID: Sarah Peterson, female    DOB: [birth year], 52 y.o.   MRN: [id number]  HPI Pt states 1 year of moderate weight gain (15 lbs), worst at the abdomen, in the context of taking several meds.  She describes her diet as being very good.  She has associated depression.  She is in the midst of a difficult divorce.   Past Medical History  Diagnosis Date  . Allergy   . Asthma   . ADHD (attention deficit hyperactivity disorder)   . MRSA (methicillin resistant staph aureus) culture positive   . 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 ACL injury tear     Seeing Dr. Ranell Patrick    Past Surgical History  Procedure Laterality Date  . Abdominal hysterectomy    . Exploratory laparotomy      History   Social History  . Marital Status: Married    Spouse Name: N/A    Number of Children: N/A  . Years of Education: N/A   Occupational History  . Not on file.   Social History Main Topics  . Smoking status: Former Games developer  . Smokeless tobacco: Not on file  . Alcohol Use: 7.0 oz/week    14 drink(s) per week  . Drug Use: No  . Sexual Activity: Not on file   Other Topics Concern  . Not on file   Social History Narrative   Married    Now separated      Regular exercise   Lives  At [facility] .       Teens at home.  Some challenges              Current Outpatient Prescriptions on File Prior to Visit  Medication Sig Dispense Refill  . albuterol (PROAIR HFA) 108 (90 BASE) MCG/ACT inhaler Inhale two puffs every 4-6 hours as needed. Pt needs to call office to schedule a physical before next refill.  1 Inhaler  2  . ALPRAZolam (XANAX) 0.25 MG tablet Take 0.25 mg by mouth at bedtime as needed.       Marland Kitchen amphetamine-dextroamphetamine (ADDERALL) 20 MG tablet Take 40 mg by mouth 2 (two) times daily.       . Estradiol (ELESTRIN) 0.52 MG/0.87 GM (0.06%) GEL Place onto the skin.        Marland Kitchen lamoTRIgine (LAMICTAL) 25 MG  tablet Take 100 mg by mouth daily. Taking 50mg  and increasing      . sertraline (ZOLOFT) 100 MG tablet Take 100 mg by mouth daily. 1/2 to 1 daily       . ABILIFY 2 MG tablet        No current facility-administered medications on file prior to visit.    Allergies  Allergen Reactions  . Amoxicillin-Pot Clavulanate     REACTION: pruritis    Family History  Problem Relation Age of Onset  . Heart failure Mother   . Bipolar disorder Mother   . Aneurysm Father     BP 126/80  Pulse 90  Ht 5\' 4"  (1.626 m)  Wt 139 lb (63.05 kg)  BMI 23.85 kg/m2  SpO2 97%    Review of Systems denies hair loss, cramps, sob, headache, polyuria, insomnia, hyperpigmentation, numbness, easy bruising, and rash on the abdomen, constipation, numbness, blurry vision, myalgias, rhinorrhea, and syncope.  She has anxiety, menopausal sxs, dry skin, excessive diaphoresis, difficulty with concentration, and hair loss.  Objective:   Physical Exam VS: see vs page GEN: no distress HEAD: head: no deformity eyes: no periorbital swelling, no proptosis external nose and ears are normal mouth: no lesion seen NECK: supple, thyroid is not enlarged CHEST WALL: no deformity LUNGS:  Clear to auscultation CV: reg rate and rhythm, no murmur ABD: abdomen is soft, nontender.  no hepatosplenomegaly.  not distended.  no hernia.  No striae MUSCULOSKELETAL: muscle bulk and strength are grossly normal.  no obvious joint swelling.  gait is normal and steady EXTEMITIES: no deformity.  no edema PULSES: dorsalis pedis intact bilat.  no carotid bruit NEURO:  cn 2-12 grossly intact.   readily moves all 4's.  sensation is intact to touch on all 4's SKIN:  Normal texture and temperature.  No rash or suspicious lesion is visible.   NODES:  None palpable at the neck PSYCH: alert, oriented x3.  Does not appear anxious nor depressed.   Lab Results  Component Value Date   TSH 0.96 09/15/2012      Assessment & Plan:  Weight gain,  usually not cortisol-related Depression, situational Dry skin, not thyroid-related

## 2013-03-07 ENCOUNTER — Telehealth: Payer: Self-pay | Admitting: Endocrinology

## 2013-03-07 NOTE — Telephone Encounter (Signed)
Pt advised lab results not back yet

## 2013-03-07 NOTE — Telephone Encounter (Signed)
Please call with lab results / Sherri S.

## 2013-03-13 LAB — CORTISOL, URINE, 24 HOUR: Cortisol (Ur), Free: 10.6 mcg/24 h (ref 4.0–50.0)

## 2013-03-14 ENCOUNTER — Telehealth: Payer: Self-pay | Admitting: Endocrinology

## 2013-03-14 NOTE — Telephone Encounter (Signed)
Per VM - please call patient w/ lab results. CB# [phone] / Sherri S.

## 2013-03-14 NOTE — Telephone Encounter (Signed)
Your cortisone in the urine is normal---good.

## 2013-03-14 NOTE — Telephone Encounter (Signed)
Pt advised and states an understanding 

## 2013-03-22 ENCOUNTER — Ambulatory Visit (AMBULATORY_SURGERY_CENTER): Payer: PRIVATE HEALTH INSURANCE | Admitting: *Deleted

## 2013-03-22 VITALS — Ht 64.0 in | Wt 138.4 lb

## 2013-03-22 DIAGNOSIS — Z1211 Encounter for screening for malignant neoplasm of colon: Secondary | ICD-10-CM

## 2013-03-22 MED ORDER — NA SULFATE-K SULFATE-MG SULF 17.5-3.13-1.6 GM/177ML PO SOLN: 1.0000 | Freq: Once | ORAL | Status: DC

## 2013-03-22 NOTE — Progress Notes (Signed)
Denies allergies to eggs or soy products. Denies complications with sedation or anesthesia. 

## 2013-03-29 ENCOUNTER — Encounter: Payer: Self-pay | Admitting: Gastroenterology

## 2013-04-06 ENCOUNTER — Ambulatory Visit (AMBULATORY_SURGERY_CENTER): Payer: PRIVATE HEALTH INSURANCE | Admitting: Gastroenterology

## 2013-04-06 ENCOUNTER — Encounter: Payer: Self-pay | Admitting: Gastroenterology

## 2013-04-06 VITALS — BP 124/76 | HR 58 | Temp 97.3°F | Resp 41 | Ht 64.0 in | Wt 138.0 lb

## 2013-04-06 DIAGNOSIS — D126 Benign neoplasm of colon, unspecified: Secondary | ICD-10-CM

## 2013-04-06 DIAGNOSIS — Z1211 Encounter for screening for malignant neoplasm of colon: Secondary | ICD-10-CM

## 2013-04-06 MED ORDER — SODIUM CHLORIDE 0.9 % IV SOLN: 500.0000 mL | INTRAVENOUS | Status: DC

## 2013-04-06 NOTE — Op Note (Signed)
[facility] [address] [place], [address]   COLONOSCOPY PROCEDURE REPORT  PATIENT: Sarah Peterson, Sarah Peterson  MR#: [id number] BIRTHDATE: Mar 15, 1961 , 51  yrs. old GENDER: Female ENDOSCOPIST: Louis Meckel, MD REFERRED XB:JYNWG Lonie Peak, M.D. PROCEDURE DATE:  04/06/2013 PROCEDURE:   Colonoscopy with snare polypectomy First Screening Colonoscopy - Avg.  risk and is 50 yrs.  old or older Yes.  Prior Negative Screening - Now for repeat screening. N/A  History of Adenoma - Now for follow-up colonoscopy & has been > or = to 3 yrs.  N/A  Polyps Removed Today? Yes. ASA CLASS:   Class II INDICATIONS:average risk screening. MEDICATIONS: MAC sedation, administered by CRNA and propofol (Diprivan) 350mg  IV  DESCRIPTION OF PROCEDURE:   After the risks benefits and alternatives of the procedure were thoroughly explained, informed consent was obtained.  A digital rectal exam revealed no abnormalities of the rectum.   The LB [id number]  endoscope was introduced through the anus and advanced to the cecum, which was identified by both the appendix and ileocecal valve. No adverse events experienced.   The quality of the prep was Suprep good  The instrument was then slowly withdrawn as the colon was fully examined.      COLON FINDINGS: A flat polyp measuring 3 mm in size was found at the cecum.  A polypectomy was performed with a cold snare.  The resection was complete and the polyp tissue was completely retrieved.   A sessile polyp measuring 4 mm in size was found in the ascending colon.  A polypectomy was performed with a cold snare.  The resection was complete and the polyp tissue was completely retrieved.   Moderate diverticulosis was noted in the sigmoid colon.   Internal hemorrhoids were found.  Retroflexed views revealed no abnormalities. The time to cecum=5 minutes 02 seconds.  Withdrawal time=10 minutes 56 seconds.  The scope was withdrawn and the procedure  completed. COMPLICATIONS: There were no complications.  ENDOSCOPIC IMPRESSION: 1.   Flat polyp measuring 3 mm in size was found at the cecum; polypectomy was performed with a cold snare 2.   Sessile polyp measuring 4 mm in size was found in the ascending colon; polypectomy was performed with a cold snare 3.   Moderate diverticulosis was noted in the sigmoid colon 4.   Internal hemorrhoids  RECOMMENDATIONS: If the polyp(s) removed today are proven to be adenomatous (pre-cancerous) polyps, you will need a repeat colonoscopy in 5 years.  Otherwise you should continue to follow colorectal cancer screening guidelines for "routine risk" patients with colonoscopy in 10 years.  You will receive a letter within 1-2 weeks with the results of your biopsy as well as final recommendations.  Please call my office if you have not received a letter after 3 weeks.   eSigned:  Louis Meckel, MD 04/06/2013 11:15 AM   cc:   PATIENT NAME:  Sarah Peterson, Sarah Peterson MR#: [id number]

## 2013-04-06 NOTE — Patient Instructions (Addendum)
YOU HAD AN ENDOSCOPIC PROCEDURE TODAY AT THE [facility]: Refer to the procedure report that was given to you for any specific questions about what was found during the examination.  If the procedure report does not answer your questions, please call your gastroenterologist to clarify.  If you requested that your care partner not be given the details of your procedure findings, then the procedure report has been included in a sealed envelope for you to review at your convenience later.  YOU SHOULD EXPECT: Some feelings of bloating in the abdomen. Passage of more gas than usual.  Walking can help get rid of the air that was put into your GI tract during the procedure and reduce the bloating. If you had a lower endoscopy (such as a colonoscopy or flexible sigmoidoscopy) you may notice spotting of blood in your stool or on the toilet paper. If you underwent a bowel prep for your procedure, then you may not have a normal bowel movement for a few days.  DIET: Your first meal following the procedure should be a light meal and then it is ok to progress to your normal diet.  A half-sandwich or bowl of soup is an example of a good first meal.  Heavy or fried foods are harder to digest and may make you feel nauseous or bloated.  Likewise meals heavy in dairy and vegetables can cause extra gas to form and this can also increase the bloating.  Drink plenty of fluids but you should avoid alcoholic beverages for 24 hours.  ACTIVITY: Your care partner should take you home directly after the procedure.  You should plan to take it easy, moving slowly for the rest of the day.  You can resume normal activity the day after the procedure however you should NOT DRIVE or use heavy machinery for 24 hours (because of the sedation medicines used during the test).    SYMPTOMS TO REPORT IMMEDIATELY: A gastroenterologist can be reached at any hour.  During normal business hours, 8:30 AM to 5:00 PM Monday through Friday,  call [phone].  After hours and on weekends, please call the GI answering service at [phone]  Emergency number who will take a message and have the physician on call contact you.   Following lower endoscopy (colonoscopy or flexible sigmoidoscopy):  Excessive amounts of blood in the stool  Significant tenderness or worsening of abdominal pains  Swelling of the abdomen that is new, acute  Fever of 100F or higher  FOLLOW UP: If any biopsies were taken you will be contacted by phone or by letter within the next 1-3 weeks.  Call your gastroenterologist if you have not heard about the biopsies in 3 weeks.  Our staff will call the home number listed on your records the next business day following your procedure to check on you and address any questions or concerns that you may have at that time regarding the information given to you following your procedure. This is a courtesy call and so if there is no answer at the home number and we have not heard from you through the emergency physician on call, we will assume that you have returned to your regular daily activities without incident.  SIGNATURES/CONFIDENTIALITY: You and/or your care partner have signed paperwork which will be entered into your electronic medical record.  These signatures attest to the fact that that the information above on your After Visit Summary has been reviewed and is understood.  Full responsibility of the   confidentiality of this discharge information lies with you and/or your care-partner.  Handouts on hemorrhoids, diverticulosis, high fiber diet, polyps

## 2013-04-06 NOTE — Progress Notes (Signed)
Stable to RR 

## 2013-04-06 NOTE — Progress Notes (Signed)
Patient did not experience any of the following events: a burn prior to discharge; a fall within the facility; wrong site/side/patient/procedure/implant event; or a hospital transfer or hospital admission upon discharge from the facility. (G8907)Patient did not have preoperative order for IV antibiotic SSI prophylaxis. (G8918) ewm 

## 2013-04-06 NOTE — Progress Notes (Signed)
Called to room to assist during endoscopic procedure.  Patient ID and intended procedure confirmed with present staff. Received instructions for my participation in the procedure from the performing physician.  

## 2013-04-09 ENCOUNTER — Telehealth: Payer: Self-pay

## 2013-04-09 NOTE — Telephone Encounter (Signed)
Left message on answering answering machine.

## 2013-04-11 ENCOUNTER — Encounter: Payer: Self-pay | Admitting: Gastroenterology

## 2013-04-18 ENCOUNTER — Ambulatory Visit: Payer: PRIVATE HEALTH INSURANCE

## 2013-04-19 ENCOUNTER — Ambulatory Visit (INDEPENDENT_AMBULATORY_CARE_PROVIDER_SITE_OTHER): Payer: PRIVATE HEALTH INSURANCE

## 2013-04-19 DIAGNOSIS — Z23 Encounter for immunization: Secondary | ICD-10-CM

## 2014-02-01 ENCOUNTER — Telehealth: Payer: Self-pay | Admitting: Internal Medicine

## 2014-02-01 NOTE — Telephone Encounter (Signed)
Pt stopped by the office to inquire of the status of refills of medications.  Apparently, she dropped off forms last week to get approval for meds to be refill by Dr. Regis Bill, please call pt back with status.

## 2014-02-01 NOTE — Telephone Encounter (Signed)
Per [facility], must have an office visit.  Dr. Regis Bill is willing to write the scripts but must be seen in the office first.  Please make an appt for the pt.  Thanks!

## 2014-02-01 NOTE — Telephone Encounter (Signed)
Left message for pt to return call to office to schedule office visit.

## 2014-03-25 ENCOUNTER — Other Ambulatory Visit: Payer: Self-pay | Admitting: Obstetrics & Gynecology

## 2014-03-26 LAB — CYTOLOGY - PAP

## 2014-04-19 ENCOUNTER — Other Ambulatory Visit: Payer: Self-pay

## 2014-06-17 ENCOUNTER — Ambulatory Visit (INDEPENDENT_AMBULATORY_CARE_PROVIDER_SITE_OTHER): Payer: BC Managed Care – PPO | Admitting: Family Medicine

## 2014-06-17 ENCOUNTER — Ambulatory Visit: Payer: BC Managed Care – PPO | Admitting: Family Medicine

## 2014-06-17 DIAGNOSIS — Z23 Encounter for immunization: Secondary | ICD-10-CM

## 2014-12-25 ENCOUNTER — Ambulatory Visit (HOSPITAL_BASED_OUTPATIENT_CLINIC_OR_DEPARTMENT_OTHER): Payer: BLUE CROSS/BLUE SHIELD | Admitting: Anesthesiology

## 2014-12-25 ENCOUNTER — Encounter: Payer: Self-pay | Admitting: Internal Medicine

## 2014-12-25 ENCOUNTER — Encounter (HOSPITAL_BASED_OUTPATIENT_CLINIC_OR_DEPARTMENT_OTHER): Payer: Self-pay | Admitting: Anesthesiology

## 2014-12-25 ENCOUNTER — Ambulatory Visit (INDEPENDENT_AMBULATORY_CARE_PROVIDER_SITE_OTHER): Payer: BLUE CROSS/BLUE SHIELD | Admitting: Internal Medicine

## 2014-12-25 ENCOUNTER — Encounter (HOSPITAL_BASED_OUTPATIENT_CLINIC_OR_DEPARTMENT_OTHER)
Admission: RE | Disposition: A | Discharge status: Home / Self Care | Payer: Self-pay | Source: Ambulatory Visit | Attending: Orthopedic Surgery

## 2014-12-25 ENCOUNTER — Other Ambulatory Visit: Payer: Self-pay | Admitting: Orthopedic Surgery

## 2014-12-25 ENCOUNTER — Ambulatory Visit (HOSPITAL_BASED_OUTPATIENT_CLINIC_OR_DEPARTMENT_OTHER)
Admission: RE | Admit: 2014-12-25 | Discharge: 2014-12-25 | Disposition: A | Discharge status: Home / Self Care | Payer: BLUE CROSS/BLUE SHIELD | Source: Ambulatory Visit | Attending: Orthopedic Surgery | Admitting: Orthopedic Surgery

## 2014-12-25 VITALS — BP 122/72 | Temp 98.6°F | Ht 63.0 in | Wt 141.2 lb

## 2014-12-25 DIAGNOSIS — L03011 Cellulitis of right finger: Secondary | ICD-10-CM | POA: Insufficient documentation

## 2014-12-25 DIAGNOSIS — L089 Local infection of the skin and subcutaneous tissue, unspecified: Secondary | ICD-10-CM

## 2014-12-25 DIAGNOSIS — Z880 Allergy status to penicillin: Secondary | ICD-10-CM | POA: Diagnosis not present

## 2014-12-25 DIAGNOSIS — F909 Attention-deficit hyperactivity disorder, unspecified type: Secondary | ICD-10-CM | POA: Diagnosis not present

## 2014-12-25 DIAGNOSIS — J45909 Unspecified asthma, uncomplicated: Secondary | ICD-10-CM | POA: Diagnosis not present

## 2014-12-25 DIAGNOSIS — Z87891 Personal history of nicotine dependence: Secondary | ICD-10-CM | POA: Insufficient documentation

## 2014-12-25 DIAGNOSIS — M479 Spondylosis, unspecified: Secondary | ICD-10-CM | POA: Insufficient documentation

## 2014-12-25 HISTORY — DX: Anxiety disorder, unspecified: F41.9

## 2014-12-25 HISTORY — DX: Nausea with vomiting, unspecified: R11.2

## 2014-12-25 HISTORY — DX: Depression, unspecified: F32.A

## 2014-12-25 HISTORY — DX: Bipolar disorder, unspecified: F31.9

## 2014-12-25 HISTORY — DX: Other specified postprocedural states: Z98.890

## 2014-12-25 HISTORY — DX: Major depressive disorder, single episode, unspecified: F32.9

## 2014-12-25 HISTORY — PX: INCISION AND DRAINAGE: SHX5863

## 2014-12-25 LAB — POCT HEMOGLOBIN-HEMACUE: Hemoglobin: 14 g/dL (ref 12.0–15.0)

## 2014-12-25 SURGERY — INCISION AND DRAINAGE
Anesthesia: General | Laterality: Right

## 2014-12-25 MED ORDER — ONDANSETRON HCL 4 MG/2ML IJ SOLN
INTRAMUSCULAR | Status: DC | PRN
  Administered 2014-12-25: 4 mg via INTRAVENOUS

## 2014-12-25 MED ORDER — OXYCODONE HCL 5 MG PO TABS
ORAL_TABLET | ORAL | Status: AC
  Filled 2014-12-25: qty 1

## 2014-12-25 MED ORDER — CEFAZOLIN SODIUM-DEXTROSE 2-3 GM-% IV SOLR
INTRAVENOUS | Status: DC | PRN
  Administered 2014-12-25: 2 g via INTRAVENOUS

## 2014-12-25 MED ORDER — HYDROMORPHONE HCL 1 MG/ML IJ SOLN
0.2500 mg | INTRAMUSCULAR | Status: DC | PRN
  Administered 2014-12-25 (×2): 0.25 mg via INTRAVENOUS

## 2014-12-25 MED ORDER — DEXAMETHASONE SODIUM PHOSPHATE 4 MG/ML IJ SOLN
INTRAMUSCULAR | Status: DC | PRN
  Administered 2014-12-25: 10 mg via INTRAVENOUS

## 2014-12-25 MED ORDER — PROPOFOL 10 MG/ML IV BOLUS
INTRAVENOUS | Status: DC | PRN
  Administered 2014-12-25: 200 mg via INTRAVENOUS

## 2014-12-25 MED ORDER — PROPOFOL 10 MG/ML IV BOLUS
INTRAVENOUS | Status: AC
  Filled 2014-12-25: qty 20

## 2014-12-25 MED ORDER — FENTANYL CITRATE (PF) 100 MCG/2ML IJ SOLN
INTRAMUSCULAR | Status: AC
  Filled 2014-12-25: qty 4

## 2014-12-25 MED ORDER — LACTATED RINGERS IV SOLN
INTRAVENOUS | Status: DC | PRN
  Administered 2014-12-25: 14:00:00 via INTRAVENOUS

## 2014-12-25 MED ORDER — MIDAZOLAM HCL 5 MG/5ML IJ SOLN
INTRAMUSCULAR | Status: DC | PRN
  Administered 2014-12-25: 2 mg via INTRAVENOUS

## 2014-12-25 MED ORDER — MIDAZOLAM HCL 2 MG/2ML IJ SOLN
INTRAMUSCULAR | Status: AC
  Filled 2014-12-25: qty 2

## 2014-12-25 MED ORDER — OXYCODONE-ACETAMINOPHEN 5-325 MG PO TABS: 1.0000 | ORAL_TABLET | ORAL | Status: AC | PRN

## 2014-12-25 MED ORDER — HYDROMORPHONE HCL 1 MG/ML IJ SOLN
INTRAMUSCULAR | Status: AC
  Filled 2014-12-25: qty 1

## 2014-12-25 MED ORDER — CHLORHEXIDINE GLUCONATE 4 % EX LIQD: 60.0000 mL | Freq: Once | CUTANEOUS | Status: DC

## 2014-12-25 MED ORDER — LIDOCAINE HCL (CARDIAC) 20 MG/ML IV SOLN
INTRAVENOUS | Status: DC | PRN
Start: 2014-12-25 — End: 2014-12-25
  Administered 2014-12-25: 75 mg via INTRAVENOUS

## 2014-12-25 MED ORDER — FENTANYL CITRATE (PF) 100 MCG/2ML IJ SOLN
INTRAMUSCULAR | Status: DC | PRN
  Administered 2014-12-25: 50 ug via INTRAVENOUS

## 2014-12-25 MED ORDER — BUPIVACAINE HCL (PF) 0.25 % IJ SOLN
INTRAMUSCULAR | Status: DC | PRN
  Administered 2014-12-25: 1.5 mL

## 2014-12-25 MED ORDER — PROMETHAZINE HCL 25 MG/ML IJ SOLN: 6.2500 mg | INTRAMUSCULAR | Status: DC | PRN

## 2014-12-25 MED ORDER — OXYCODONE HCL 5 MG PO TABS
5.0000 mg | ORAL_TABLET | Freq: Once | ORAL | Status: AC
  Administered 2014-12-25: 5 mg via ORAL

## 2014-12-25 SURGICAL SUPPLY — 69 items
APL SKNCLS STERI-STRIP NONHPOA (GAUZE/BANDAGES/DRESSINGS)
BAG DECANTER FOR FLEXI CONT (MISCELLANEOUS) IMPLANT
BANDAGE ELASTIC 3 VELCRO ST LF (GAUZE/BANDAGES/DRESSINGS) ×3 IMPLANT
BANDAGE ELASTIC 4 VELCRO ST LF (GAUZE/BANDAGES/DRESSINGS) IMPLANT
BENZOIN TINCTURE PRP APPL 2/3 (GAUZE/BANDAGES/DRESSINGS) IMPLANT
BLADE SURG 15 STRL LF DISP TIS (BLADE) ×1 IMPLANT
BLADE SURG 15 STRL SS (BLADE) ×3
BNDG CMPR 9X4 STRL LF SNTH (GAUZE/BANDAGES/DRESSINGS) ×1
BNDG COHESIVE 1X5 TAN STRL LF (GAUZE/BANDAGES/DRESSINGS) ×2 IMPLANT
BNDG ESMARK 4X9 LF (GAUZE/BANDAGES/DRESSINGS) ×2 IMPLANT
BNDG GAUZE ELAST 4 BULKY (GAUZE/BANDAGES/DRESSINGS) IMPLANT
CLOSURE WOUND 1/2 X4 (GAUZE/BANDAGES/DRESSINGS) ×1
CORDS BIPOLAR (ELECTRODE) ×3 IMPLANT
COVER BACK TABLE 60X90IN (DRAPES) ×3 IMPLANT
CUFF TOURNIQUET SINGLE 18IN (TOURNIQUET CUFF) IMPLANT
DECANTER SPIKE VIAL GLASS SM (MISCELLANEOUS) IMPLANT
DRAPE EXTREMITY T 121X128X90 (DRAPE) ×3 IMPLANT
DRAPE SURG 17X23 STRL (DRAPES) ×3 IMPLANT
DURAPREP 26ML APPLICATOR (WOUND CARE) ×3 IMPLANT
GAUZE PACKING IODOFORM 1/4X15 (GAUZE/BANDAGES/DRESSINGS) IMPLANT
GAUZE SPONGE 4X4 12PLY STRL (GAUZE/BANDAGES/DRESSINGS) ×3 IMPLANT
GAUZE XEROFORM 1X8 LF (GAUZE/BANDAGES/DRESSINGS) ×2 IMPLANT
GLOVE BIOGEL PI IND STRL 7.0 (GLOVE) IMPLANT
GLOVE BIOGEL PI INDICATOR 7.0 (GLOVE) ×2
GLOVE ECLIPSE 6.5 STRL STRAW (GLOVE) ×2 IMPLANT
GLOVE SURG SYN 8.0 (GLOVE) ×3 IMPLANT
GLOVE SURG SYN 8.0 PF PI (GLOVE) ×2 IMPLANT
GOWN STRL REUS W/ TWL LRG LVL3 (GOWN DISPOSABLE) ×1 IMPLANT
GOWN STRL REUS W/TWL LRG LVL3 (GOWN DISPOSABLE) ×3
GOWN STRL REUS W/TWL XL LVL3 (GOWN DISPOSABLE) ×5 IMPLANT
HANDPIECE INTERPULSE COAX TIP (DISPOSABLE)
LOOP VESSEL MAXI BLUE (MISCELLANEOUS) IMPLANT
NDL HYPO 25X1 1.5 SAFETY (NEEDLE) IMPLANT
NDL PRECISIONGLIDE 27X1.5 (NEEDLE) IMPLANT
NEEDLE HYPO 25X1 1.5 SAFETY (NEEDLE) ×3 IMPLANT
NEEDLE PRECISIONGLIDE 27X1.5 (NEEDLE) IMPLANT
NS IRRIG 1000ML POUR BTL (IV SOLUTION) ×3 IMPLANT
PACK BASIN DAY SURGERY FS (CUSTOM PROCEDURE TRAY) ×3 IMPLANT
PAD CAST 3X4 CTTN HI CHSV (CAST SUPPLIES) ×1 IMPLANT
PADDING CAST ABS 3INX4YD NS (CAST SUPPLIES) ×2
PADDING CAST ABS 4INX4YD NS (CAST SUPPLIES) ×2
PADDING CAST ABS COTTON 3X4 (CAST SUPPLIES) ×1 IMPLANT
PADDING CAST ABS COTTON 4X4 ST (CAST SUPPLIES) ×1 IMPLANT
PADDING CAST COTTON 3X4 STRL (CAST SUPPLIES) ×3
SET HNDPC FAN SPRY TIP SCT (DISPOSABLE) IMPLANT
SHEET MEDIUM DRAPE 40X70 STRL (DRAPES) ×3 IMPLANT
SPLINT PLASTER CAST XFAST 3X15 (CAST SUPPLIES) IMPLANT
SPLINT PLASTER CAST XFAST 4X15 (CAST SUPPLIES) IMPLANT
SPLINT PLASTER XTRA FAST SET 4 (CAST SUPPLIES)
SPLINT PLASTER XTRA FASTSET 3X (CAST SUPPLIES)
STOCKINETTE 4X48 STRL (DRAPES) ×3 IMPLANT
STRIP CLOSURE SKIN 1/2X4 (GAUZE/BANDAGES/DRESSINGS) ×1 IMPLANT
SUT ETHILON 3 0 PS 1 (SUTURE) IMPLANT
SUT ETHILON 5 0 PS 2 18 (SUTURE) IMPLANT
SUT PROLENE 3 0 PS 2 (SUTURE) IMPLANT
SUT VIC AB 4-0 P-3 18XBRD (SUTURE) IMPLANT
SUT VIC AB 4-0 P3 18 (SUTURE)
SUT VICRYL RAPIDE 4-0 (SUTURE) IMPLANT
SUT VICRYL RAPIDE 4/0 PS 2 (SUTURE) IMPLANT
SWAB COLLECTION DEVICE MRSA (MISCELLANEOUS) ×3 IMPLANT
SYR BULB 3OZ (MISCELLANEOUS) ×3 IMPLANT
SYR CONTROL 10ML LL (SYRINGE) IMPLANT
SYRINGE 10CC LL (SYRINGE) ×2 IMPLANT
TOWEL OR 17X24 6PK STRL BLUE (TOWEL DISPOSABLE) ×3 IMPLANT
TUBE ANAEROBIC SPECIMEN COL (MISCELLANEOUS) ×3 IMPLANT
TUBE CONNECTING 20'X1/4 (TUBING)
TUBE CONNECTING 20X1/4 (TUBING) IMPLANT
UNDERPAD 30X30 (UNDERPADS AND DIAPERS) ×1 IMPLANT
YANKAUER SUCT BULB TIP NO VENT (SUCTIONS) IMPLANT

## 2014-12-25 NOTE — Transfer of Care (Signed)
Immediate Anesthesia Transfer of Care Note  Patient: Sarah Peterson  Procedure(s) Performed: Procedure(s): Incision and Drainage Right Index Finger (Right)  Patient Location: PACU  Anesthesia Type:General  Level of Consciousness: sedated and patient cooperative  Airway & Oxygen Therapy: Patient Spontanous Breathing and Patient connected to face mask oxygen  Post-op Assessment: Report given to RN and Post -op Vital signs reviewed and stable  Post vital signs: Reviewed and stable  Last Vitals:  Filed Vitals:   12/25/14 1253  BP: 130/79  Pulse: 76  Temp: 36.7 C  Resp: 20    Complications: No apparent anesthesia complications

## 2014-12-25 NOTE — Progress Notes (Signed)
Pre visit review using our clinic review tool, if applicable. No additional management support is needed unless otherwise documented below in the visit note.  Chief Complaint  Patient presents with  . Rt Middle Finger Swelling and Pain    Started over the past weekend    HPI: Patient Sarah Peterson  comes in today for SDA for  new problem evaluation. Onset  Over 4 days pain and swelling  Had manicure last Wednesday but no problem  noi trauma or fb. Getting progressively worse and now sswelling down finger  Getting worse .  rom ok  advil and elevation.  ROS: See pertinent positives and negatives per HPI. No fever chills  Remote hx of mrsa skin   Past Medical History  Diagnosis Date  . Allergy   . Asthma   . ADHD (attention deficit hyperactivity disorder)   . MRSA (methicillin resistant staph aureus) culture positive   . DJD (degenerative joint disease)     back and cspine, some forminal stenosis evluated at [facility]  . ACL injury tear     Seeing Dr. Veverly Fells  . PONV (postoperative nausea and vomiting)   . Depression   . Bipolar disorder   . Anxiety     Family History  Problem Relation Age of Onset  . Heart failure Mother   . Bipolar disorder Mother   . Aneurysm Father   . Colon cancer Neg Hx   . Esophageal cancer Neg Hx   . Rectal cancer Neg Hx   . Stomach cancer Neg Hx     History   Social History  . Marital Status: Married    Spouse Name: N/A  . Number of Children: N/A  . Years of Education: N/A   Social History Main Topics  . Smoking status: Former Research scientist (life sciences)  . Smokeless tobacco: Never Used  . Alcohol Use: 7.0 oz/week    14 Standard drinks or equivalent per week  . Drug Use: No  . Sexual Activity: Not on file   Other Topics Concern  . None   Social History Narrative   Married    Now separated      Regular exercise   Lives  At [facility] .       Teens at home.  Some challenges              No facility-administered medications prior to visit.    Outpatient Prescriptions Prior to Visit  Medication Sig Dispense Refill  . albuterol (PROAIR HFA) 108 (90 BASE) MCG/ACT inhaler Inhale two puffs every 4-6 hours as needed. Pt needs to call office to schedule a physical before next refill. 1 Inhaler 2  . ALPRAZolam (XANAX) 0.25 MG tablet Take 0.25 mg by mouth at bedtime as needed.     Marland Kitchen amphetamine-dextroamphetamine (ADDERALL) 20 MG tablet Take 40 mg by mouth 2 (two) times daily.     . Lurasidone HCl (LATUDA) 20 MG TABS Take 1 tablet by mouth 1 day or 1 dose.    . sertraline (ZOLOFT) 100 MG tablet Take 100 mg by mouth daily. 1/2 to 1 daily     . ABILIFY 2 MG tablet     . Estradiol (ELESTRIN) 0.52 MG/0.87 GM (0.06%) GEL Place onto the skin.      Marland Kitchen lamoTRIgine (LAMICTAL) 25 MG tablet Take 100 mg by mouth daily. Taking 50mg  and increasing       EXAM:  BP 122/72 mmHg  Temp(Src) 98.6 F (37 C) (Oral)  Ht 5\' 3"  (1.6  m)  Wt 141 lb 3.2 oz (64.048 kg)  BMI 25.02 kg/m2  Body mass index is 25.02 kg/(m^2).  GENERAL: vitals reviewed and listed above, alert, oriented, appears well hydrated and in no acute distress  middle  finger   With redness  Nail but down to dip and pul of finger is very swelling  And redness streaking proximally  Past dip area   Tenderness mostly is medial distal .  Area  No fluctuance cap refill  2 seconds  PSYCH: pleasant and cooperative, no obvious depression or anxiety  ASSESSMENT AND PLAN:  Discussed the following assessment and plan:  Finger infection - index finger no ob local pus concern about paryn and felon  soft tissue infection.  get hand evaluation  antibiotic  - Plan: Ambulatory referral to Hand Surgery Pat to be seen today  -Patient advised to return or notify health care team  if symptoms worsen ,persist or new concerns arise.  Patient Instructions  Want yyou to see a hand person  Concern that infrection is in the soft tissue area  Not just the nail area.  Antibiotics .  And soaking in the interim .     Standley Brooking. Panosh M.D.

## 2014-12-25 NOTE — Anesthesia Postprocedure Evaluation (Signed)
  Anesthesia Post-op Note  Patient: Sarah Peterson  Procedure(s) Performed: Procedure(s): Incision and Drainage Right Index Finger (Right)  Patient Location: PACU  Anesthesia Type:General  Level of Consciousness: awake and alert   Airway and Oxygen Therapy: Patient Spontanous Breathing  Post-op Pain: mild  Post-op Assessment: Post-op Vital signs reviewed              Post-op Vital Signs: stable  Last Vitals:  Filed Vitals:   12/25/14 1515  BP: 142/83  Pulse: 73  Temp:   Resp: 17    Complications: No apparent anesthesia complications

## 2014-12-25 NOTE — Anesthesia Procedure Notes (Signed)
Procedure Name: LMA Insertion Date/Time: 12/25/2014 2:27 PM Performed by: Lyndee Leo Pre-anesthesia Checklist: Patient identified, Emergency Drugs available, Suction available and Patient being monitored Patient Re-evaluated:Patient Re-evaluated prior to inductionOxygen Delivery Method: Circle System Utilized Preoxygenation: Pre-oxygenation with 100% oxygen Intubation Type: IV induction Ventilation: Mask ventilation without difficulty LMA: LMA inserted LMA Size: 4.0 Number of attempts: 1 Airway Equipment and Method: Bite block Placement Confirmation: positive ETCO2 Tube secured with: Tape Dental Injury: Teeth and Oropharynx as per pre-operative assessment

## 2014-12-25 NOTE — Anesthesia Preprocedure Evaluation (Signed)
Anesthesia Evaluation  Patient identified by MRN, date of birth, ID band Patient awake    Airway Mallampati: I       Dental  (+) Teeth Intact   Pulmonary asthma , former smoker,  breath sounds clear to auscultation        Cardiovascular Rhythm:Regular Rate:Normal     Neuro/Psych ADHD   GI/Hepatic negative GI ROS, Neg liver ROS,   Endo/Other  negative endocrine ROS  Renal/GU negative Renal ROS     Musculoskeletal  (+) Arthritis -,   Abdominal   Peds  Hematology   Anesthesia Other Findings   Reproductive/Obstetrics                             Anesthesia Physical Anesthesia Plan  ASA: II  Anesthesia Plan: General   Post-op Pain Management:    Induction: Intravenous  Airway Management Planned: LMA and Oral ETT  Additional Equipment:   Intra-op Plan:   Post-operative Plan: Extubation in OR  Informed Consent: I have reviewed the patients History and Physical, chart, labs and discussed the procedure including the risks, benefits and alternatives for the proposed anesthesia with the patient or authorized representative who has indicated his/her understanding and acceptance.   Dental advisory given  Plan Discussed with:   Anesthesia Plan Comments:         Anesthesia Quick Evaluation

## 2014-12-25 NOTE — Op Note (Signed)
See note [phone]

## 2014-12-25 NOTE — Patient Instructions (Addendum)
Want yyou to see a hand person  Concern that infrection is in the soft tissue area  Not just the nail area.  Antibiotics .  And soaking in the interim .

## 2014-12-25 NOTE — H&P (Signed)
Sarah Peterson is an 54 y.o. female.   Chief Complaint: right index pain and swelling times 48 hours HPI: as above with worsening pain and swelling to right index finger  Past Medical History  Diagnosis Date  . Allergy   . Asthma   . ADHD (attention deficit hyperactivity disorder)   . MRSA (methicillin resistant staph aureus) culture positive   . DJD (degenerative joint disease)     back and cspine, some forminal stenosis evluated at [place]  . ACL injury tear     Seeing Dr. Veverly Fells    Past Surgical History  Procedure Laterality Date  . Abdominal hysterectomy    . Exploratory laparotomy      Family History  Problem Relation Age of Onset  . Heart failure Mother   . Bipolar disorder Mother   . Aneurysm Father   . Colon cancer Neg Hx   . Esophageal cancer Neg Hx   . Rectal cancer Neg Hx   . Stomach cancer Neg Hx    Social History:  reports that she has quit smoking. She has never used smokeless tobacco. She reports that she drinks about 7.0 oz of alcohol per week. She reports that she does not use illicit drugs.  Allergies:  Allergies  Allergen Reactions  . Amoxicillin-Pot Clavulanate     REACTION: pruritis    No prescriptions prior to admission    No results found for this or any previous visit (from the past 48 hour(s)). No results found.  Review of Systems  All other systems reviewed and are negative.   There were no vitals taken for this visit. Physical Exam  Constitutional: She is oriented to person, place, and time. She appears well-developed and well-nourished.  HENT:  Head: Normocephalic and atraumatic.  Cardiovascular: Normal rate.   Respiratory: Effort normal.  Musculoskeletal:       Right hand: She exhibits tenderness and swelling.  Right index felon/paronychia  Neurological: She is alert and oriented to person, place, and time.  Skin: Skin is warm. There is erythema.  Psychiatric: She has a normal mood and affect. Her behavior is normal. Judgment  and thought content normal.     Assessment/Plan As above   Plan I and D above  [name] A 12/25/2014, 12:33 PM

## 2014-12-25 NOTE — Discharge Instructions (Addendum)

## 2014-12-26 ENCOUNTER — Encounter (HOSPITAL_BASED_OUTPATIENT_CLINIC_OR_DEPARTMENT_OTHER): Payer: Self-pay | Admitting: Orthopedic Surgery

## 2014-12-27 NOTE — Op Note (Signed)
[name], Sarah Peterson NO.:  [id number]  MEDICAL RECORD NO.:  [id number]  LOCATION:                               FACILITY:  [facility]  PHYSICIAN:  Sheral Apley. Rhiannan Kievit, M.D.DATE OF BIRTH:  Jan 31, 1961  DATE OF PROCEDURE:  12/25/2014 DATE OF DISCHARGE:                              OPERATIVE REPORT   PREOPERATIVE DIAGNOSIS:  Right index finger felon.  POSTOPERATIVE DIAGNOSIS:  Right index finger felon.  PROCEDURE:  Incision and drainage of right index finger felon.  SURGEON:  Sheral Apley. Burney Gauze, M.D.  ASSISTANT:  None.  ANESTHESIA:  General.  COMPLICATIONS:  No complications.  DRAINS:  No drains.  DESCRIPTION OF PROCEDURE:  The patient was taken to the operating suite. After induction of general anesthetic, right upper extremity was prepped and draped in sterile fashion.  Esmarch was used to exsanguinate the limb.  Tourniquet was inflated to 250 mmHg.  At this point in time, midlateral incision was made over the right index finger on the ulnar border.  Skin was incised sharply.  Dissection carried deep. Neurovascular bundles retracted volarly.  Purulence was encountered and cultured.  We spread until all the septa were broken up.  We then irrigated with 500 mL of normal saline and packed the septum with 1 x 8 Xeroform gauze, 4x4s, and Coban wrap.  The patient tolerated the procedure well and went to recovery in stable fashion.     Sheral Apley Burney Gauze, M.D.     MAW/MEDQ  D:  12/25/2014  T:  12/26/2014  Job:  [id number]

## 2014-12-28 LAB — CULTURE, ROUTINE-ABSCESS

## 2014-12-30 LAB — ANAEROBIC CULTURE

## 2015-02-06 LAB — AFB CULTURE WITH SMEAR (NOT AT ARMC): Acid Fast Smear: NONE SEEN

## 2015-08-19 ENCOUNTER — Encounter: Payer: Self-pay | Admitting: *Deleted

## 2015-08-26 ENCOUNTER — Ambulatory Visit (INDEPENDENT_AMBULATORY_CARE_PROVIDER_SITE_OTHER): Payer: BLUE CROSS/BLUE SHIELD | Admitting: *Deleted

## 2015-08-26 DIAGNOSIS — I781 Nevus, non-neoplastic: Secondary | ICD-10-CM

## 2015-08-26 NOTE — Progress Notes (Signed)
X=.3% Sotradecol administered with a 27g butterfly.  Patient received a total of 4cc.  Treated a few spiders with sclero and then a few areas on her face with CL. Easy access and tol well. Follow prn.  Cutaneous Laser:pulsed mode  810j/cm2 400 ms delay  13 ms Duration 0.5 spot  Total pulses: 157 Total energy 181.07  Total time::01  Photos: No.  Compression stockings applied: Yes.

## 2016-09-02 ENCOUNTER — Other Ambulatory Visit: Payer: Self-pay | Admitting: Obstetrics & Gynecology

## 2016-09-02 ENCOUNTER — Ambulatory Visit: Payer: BLUE CROSS/BLUE SHIELD | Admitting: *Deleted

## 2016-09-02 DIAGNOSIS — R928 Other abnormal and inconclusive findings on diagnostic imaging of breast: Secondary | ICD-10-CM

## 2016-09-02 DIAGNOSIS — I781 Nevus, non-neoplastic: Secondary | ICD-10-CM

## 2016-09-02 NOTE — Progress Notes (Signed)
Pt wanted me to look at two spots that are healing slowly. New spiders have formed around them so I told her she would need another treatment. Explained sometimes when one area closes, the blood enlarges surrounding vessels and new spiders appear. Suggested she wait til she has more, since I can't charge her 1/2 a syringe. She will follow up at a later date.

## 2016-09-07 ENCOUNTER — Other Ambulatory Visit: Payer: Self-pay | Admitting: Obstetrics & Gynecology

## 2016-09-07 ENCOUNTER — Ambulatory Visit
Admission: RE | Admit: 2016-09-07 | Discharge: 2016-09-07 | Disposition: A | Discharge status: Home / Self Care | Payer: BLUE CROSS/BLUE SHIELD | Source: Ambulatory Visit | Attending: Obstetrics & Gynecology | Admitting: Obstetrics & Gynecology

## 2016-09-07 DIAGNOSIS — R928 Other abnormal and inconclusive findings on diagnostic imaging of breast: Secondary | ICD-10-CM

## 2016-09-08 ENCOUNTER — Ambulatory Visit
Admission: RE | Admit: 2016-09-08 | Discharge: 2016-09-08 | Disposition: A | Discharge status: Home / Self Care | Payer: BLUE CROSS/BLUE SHIELD | Source: Ambulatory Visit | Attending: Obstetrics & Gynecology | Admitting: Obstetrics & Gynecology

## 2016-09-08 DIAGNOSIS — R928 Other abnormal and inconclusive findings on diagnostic imaging of breast: Secondary | ICD-10-CM

## 2017-03-25 ENCOUNTER — Encounter: Payer: Self-pay | Admitting: Internal Medicine

## 2017-04-07 DIAGNOSIS — Z9889 Other specified postprocedural states: Secondary | ICD-10-CM | POA: Insufficient documentation

## 2017-05-07 DIAGNOSIS — T8140XA Infection following a procedure, unspecified, initial encounter: Secondary | ICD-10-CM | POA: Insufficient documentation

## 2017-07-08 DIAGNOSIS — M86611 Other chronic osteomyelitis, right shoulder: Secondary | ICD-10-CM | POA: Insufficient documentation

## 2018-09-21 IMAGING — MG STEREOTACTIC CORE NEEDLE BIOPSY
8 of 15 series · 8 of 23 positions shown · non-contrast
Comparison: Previous exams.

ADDENDUM:
Pathology revealed fibrocystic changes with adenosis and
microcalcifications in the left breast. This was found to be
concordant by Dr. Petty Facundo. Pathology results were discussed with the
patient by telephone. The patient reported doing well after the
biopsy with tenderness and bruising at the site. Post biopsy
instructions and care were reviewed and questions were answered. The
patient was encouraged to call [REDACTED] for any additional concerns. The patient was instructed to
return for annual screening mammography at [REDACTED] and
informed that a reminder letter would be sent regarding this
appointment.

Pathology results reported by Hem Erin RN, BSN on 09/09/2016.
CLINICAL DATA: 55-year-old female for tissue sampling of
indeterminate calcifications within the lower inner left breast.
EXAM:
LEFT BREAST STEREOTACTIC CORE NEEDLE BIOPSY

[L CC (1 of 8)]
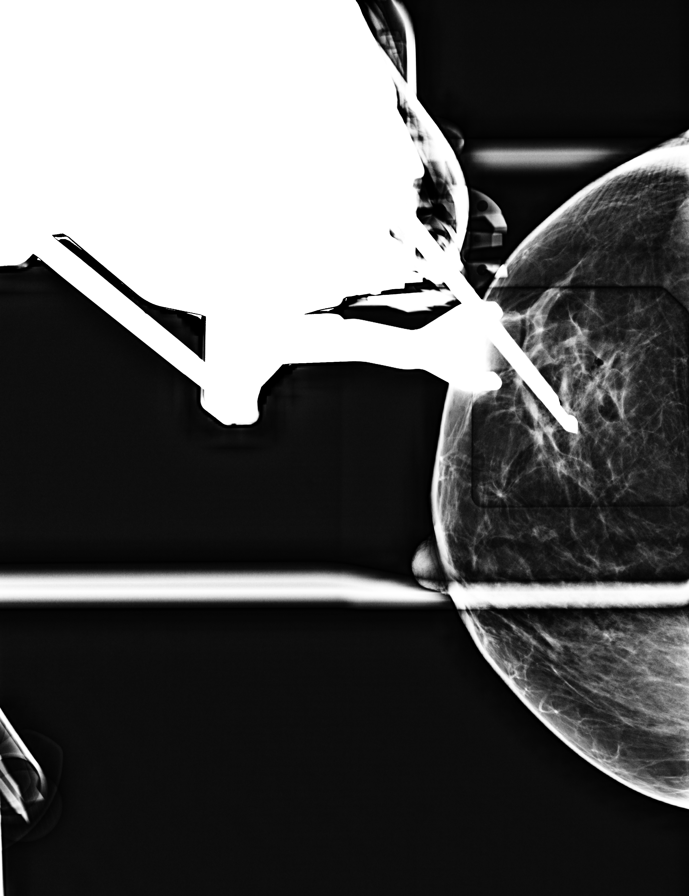

[L CC (2 of 8)]
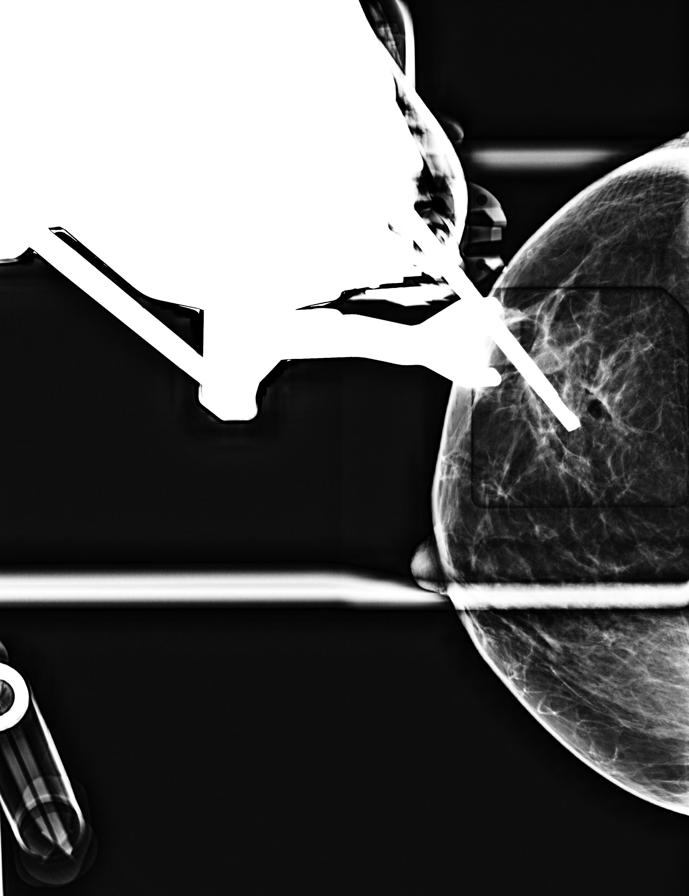

[L CC (3 of 8)]
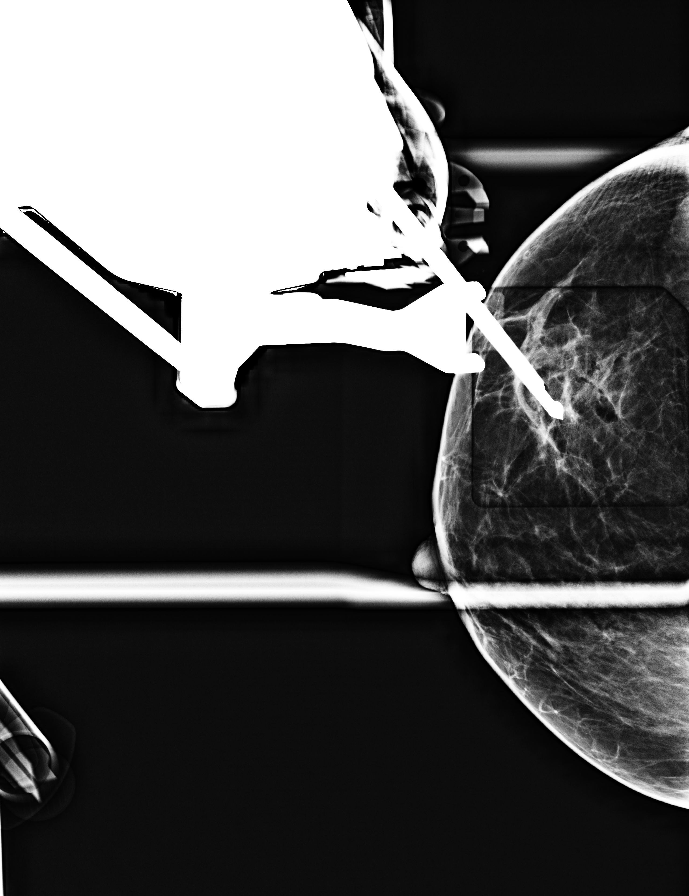

[L CC (4 of 8)]
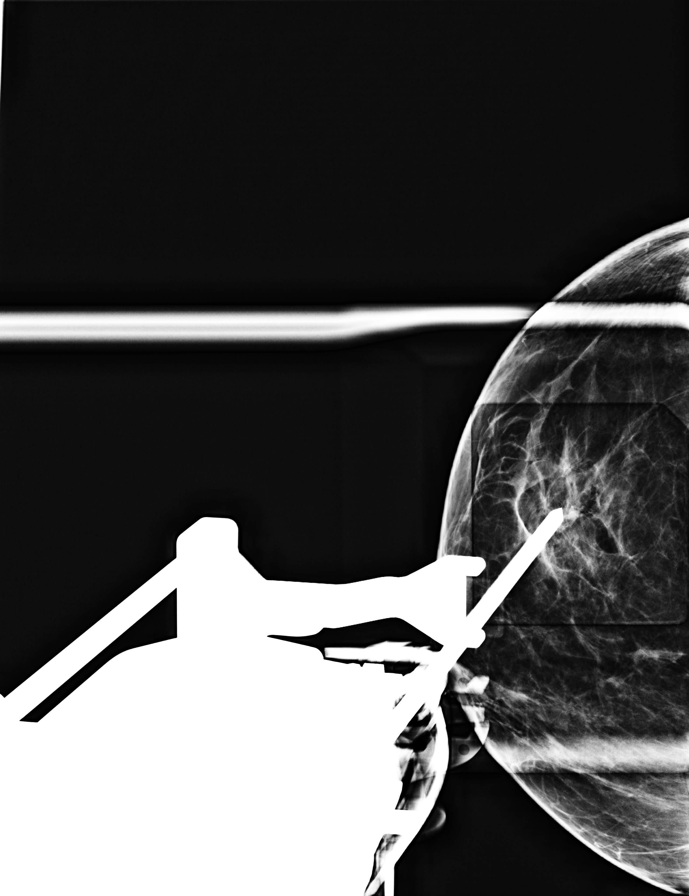

[L CC (5 of 8)]
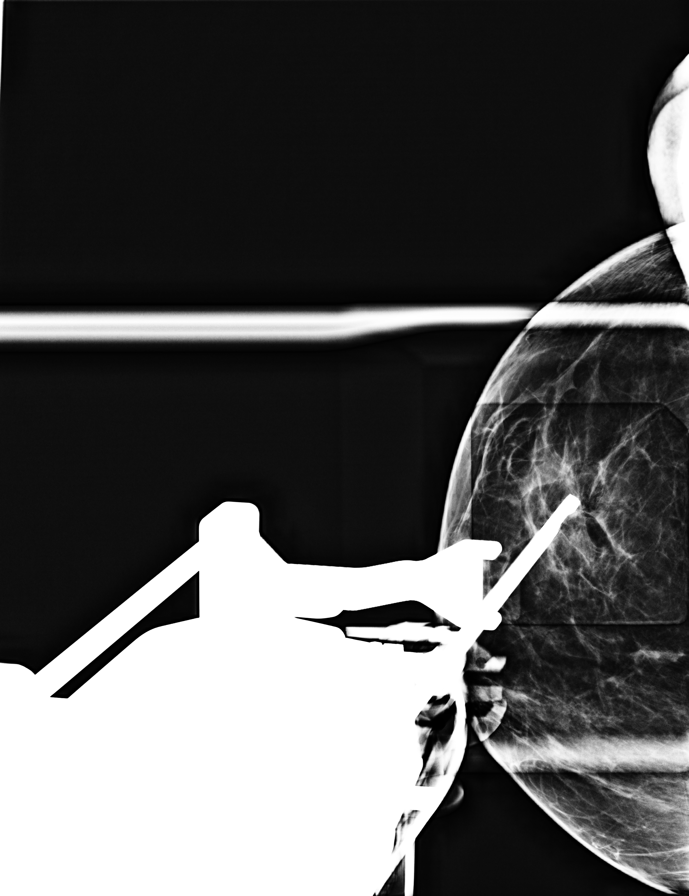

[L CC (6 of 8)]
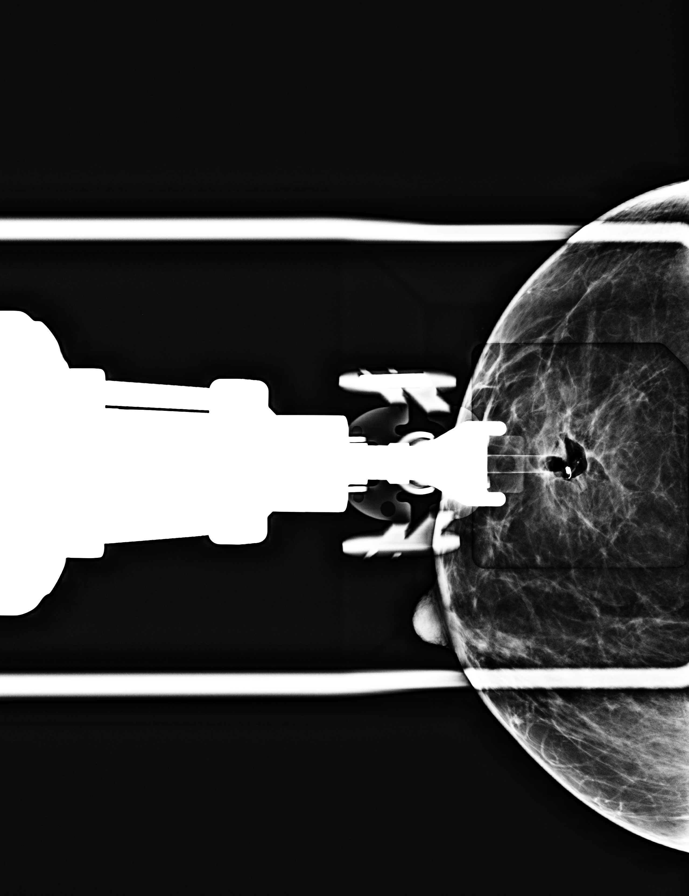

[L CC (7 of 8)]
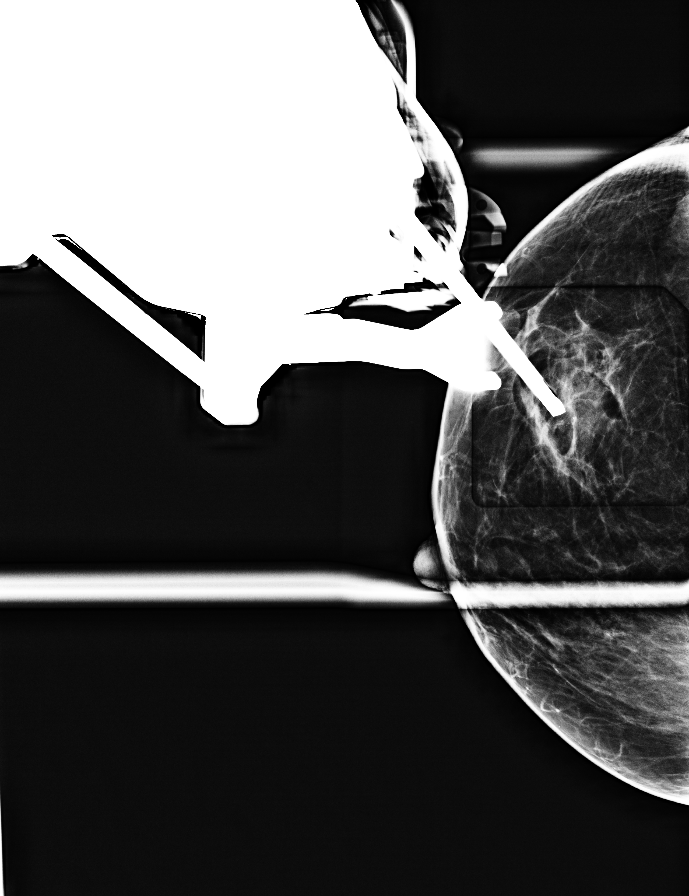

[L CC (8 of 8)]
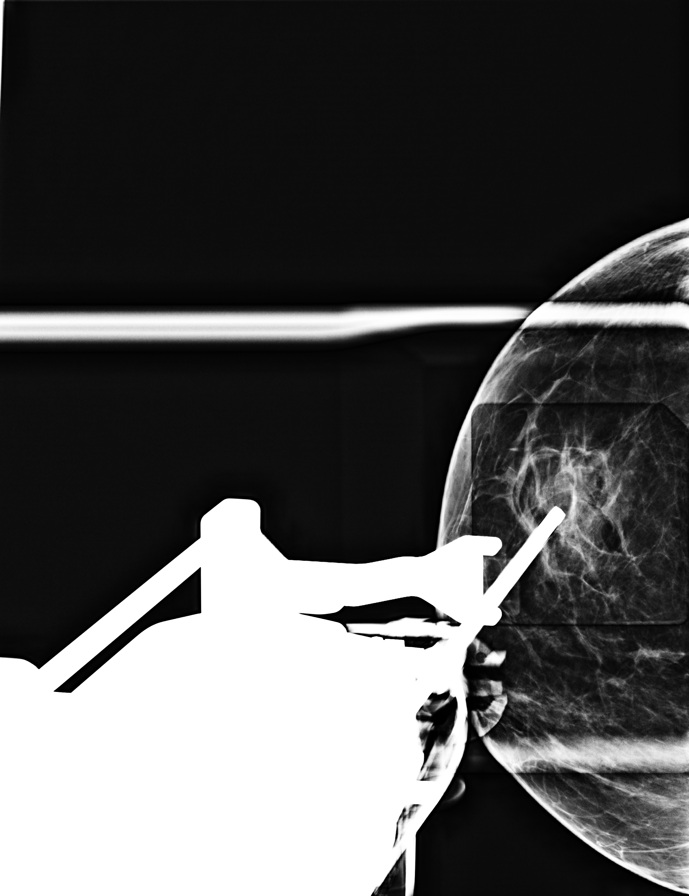

[8 of 23 positions shown; findings below may reference images not displayed]



Using sterile technique and 1% Lidocaine as local anesthetic, under
stereotactic guidance, a 9 gauge vacuum assisted needle device was
used to perform core needle biopsy of calcifications within the
lower inner quadrant of the left breast using a superior approach.
Specimen radiograph was performed showing calcifications. Specimens
with calcifications are identified for pathology.

At the conclusion of the procedure, a coil shaped tissue marker clip
was deployed into the biopsy cavity. Follow-up 2-view mammogram was
performed and dictated separately.
IMPRESSION: Stereotactic-guided biopsy of calcifications within the lower inner
left breast. No apparent complications.

Pathology will be followed.

## 2019-03-02 DIAGNOSIS — H409 Unspecified glaucoma: Secondary | ICD-10-CM | POA: Insufficient documentation

## 2019-03-02 DIAGNOSIS — Z9109 Other allergy status, other than to drugs and biological substances: Secondary | ICD-10-CM | POA: Insufficient documentation

## 2019-03-02 DIAGNOSIS — F909 Attention-deficit hyperactivity disorder, unspecified type: Secondary | ICD-10-CM | POA: Insufficient documentation

## 2019-03-13 DIAGNOSIS — R2232 Localized swelling, mass and lump, left upper limb: Secondary | ICD-10-CM | POA: Insufficient documentation

## 2019-03-13 DIAGNOSIS — M720 Palmar fascial fibromatosis [Dupuytren]: Secondary | ICD-10-CM | POA: Insufficient documentation

## 2019-07-31 DIAGNOSIS — R202 Paresthesia of skin: Secondary | ICD-10-CM | POA: Insufficient documentation

## 2019-07-31 DIAGNOSIS — R200 Anesthesia of skin: Secondary | ICD-10-CM | POA: Insufficient documentation

## 2019-10-01 DIAGNOSIS — G5601 Carpal tunnel syndrome, right upper limb: Secondary | ICD-10-CM | POA: Insufficient documentation

## 2020-02-18 ENCOUNTER — Ambulatory Visit (INDEPENDENT_AMBULATORY_CARE_PROVIDER_SITE_OTHER): Payer: Self-pay | Admitting: Podiatry

## 2020-02-18 ENCOUNTER — Other Ambulatory Visit: Payer: Self-pay

## 2020-02-18 DIAGNOSIS — L600 Ingrowing nail: Secondary | ICD-10-CM

## 2020-02-18 MED ORDER — GENTAMICIN SULFATE 0.1 % EX CREA: 1.0000 "application " | TOPICAL_CREAM | Freq: Two times a day (BID) | CUTANEOUS | 1 refills | Status: AC

## 2020-02-18 NOTE — Progress Notes (Signed)
   Subjective: Patient presents today for evaluation of pain to the medial border of the right great toe. Patient is concerned for possible ingrown nail.  Patient states that recently over the last few weeks it has become increasingly painful.  She is concerned because she is leaving on vacation this Saturday and does not want to be experiencing pain and tenderness to the area.  Patient presents today for further treatment and evaluation.  Past Medical History:  Diagnosis Date  . ACL injury tear    Seeing Dr. Veverly Fells  . ADHD (attention deficit hyperactivity disorder)   . Allergy   . Anxiety   . Asthma   . Bipolar disorder ([place])   . Depression   . DJD (degenerative joint disease)    back and cspine, some forminal stenosis evluated at [facility]  . MRSA (methicillin resistant staph aureus) culture positive   . PONV (postoperative nausea and vomiting)     Objective:  General: Well developed, nourished, in no acute distress, alert and oriented x3   Dermatology: Skin is warm, dry and supple bilateral.  Medial border right great toe appears to be erythematous with evidence of an ingrowing nail. Pain on palpation noted to the border of the nail fold. The remaining nails appear unremarkable at this time. There are no open sores, lesions.  Vascular: Dorsalis Pedis artery and Posterior Tibial artery pedal pulses palpable. No lower extremity edema noted.   Neruologic: Grossly intact via light touch bilateral.  Musculoskeletal: Muscular strength within normal limits in all groups bilateral. Normal range of motion noted to all pedal and ankle joints.   Assesement: #1 Paronychia with ingrowing nail medial border right great toe #2 Pain in toe #3 Incurvated nail  Plan of Care:  1. Patient evaluated.  2. Discussed treatment alternatives and plan of care. Explained nail avulsion procedure and post procedure course to patient. 3. Patient opted for temporary partial nail avulsion of the medial border  right great toe.  4. Prior to procedure, local anesthesia infiltration utilized using 3 ml of a 50:50 mixture of 2% plain lidocaine and 0.5% plain marcaine in a normal hallux block fashion and a betadine prep performed.  5. Partial temporary nail avulsion without chemical matrixectomy performed followed by alcohol flush.  6. Light dressing applied. 7. Return to clinic as needed.  Edrick Kins, DPM [facility]  Dr. Edrick Kins, [address]                                        [place], [place] [address]                Office [phone]  Fax [phone]

## 2020-02-18 NOTE — Patient Instructions (Signed)

## 2020-02-21 ENCOUNTER — Telehealth: Payer: Self-pay | Admitting: Podiatry

## 2020-02-21 NOTE — Telephone Encounter (Signed)
Pt would like a call from the Dr please assist

## 2021-12-04 DIAGNOSIS — M533 Sacrococcygeal disorders, not elsewhere classified: Secondary | ICD-10-CM | POA: Diagnosis not present

## 2021-12-04 DIAGNOSIS — M41125 Adolescent idiopathic scoliosis, thoracolumbar region: Secondary | ICD-10-CM | POA: Diagnosis not present

## 2021-12-04 DIAGNOSIS — G8929 Other chronic pain: Secondary | ICD-10-CM | POA: Diagnosis not present

## 2021-12-04 DIAGNOSIS — M545 Low back pain, unspecified: Secondary | ICD-10-CM | POA: Diagnosis not present

## 2022-01-01 DIAGNOSIS — M4726 Other spondylosis with radiculopathy, lumbar region: Secondary | ICD-10-CM | POA: Diagnosis not present

## 2022-04-02 DIAGNOSIS — D225 Melanocytic nevi of trunk: Secondary | ICD-10-CM | POA: Diagnosis not present

## 2022-04-02 DIAGNOSIS — D2261 Melanocytic nevi of right upper limb, including shoulder: Secondary | ICD-10-CM | POA: Diagnosis not present

## 2022-04-02 DIAGNOSIS — D1801 Hemangioma of skin and subcutaneous tissue: Secondary | ICD-10-CM | POA: Diagnosis not present

## 2022-04-02 DIAGNOSIS — D224 Melanocytic nevi of scalp and neck: Secondary | ICD-10-CM | POA: Diagnosis not present

## 2022-04-02 DIAGNOSIS — Z85828 Personal history of other malignant neoplasm of skin: Secondary | ICD-10-CM | POA: Diagnosis not present

## 2022-04-02 DIAGNOSIS — L918 Other hypertrophic disorders of the skin: Secondary | ICD-10-CM | POA: Diagnosis not present

## 2022-04-02 DIAGNOSIS — L578 Other skin changes due to chronic exposure to nonionizing radiation: Secondary | ICD-10-CM | POA: Diagnosis not present

## 2022-04-02 DIAGNOSIS — L821 Other seborrheic keratosis: Secondary | ICD-10-CM | POA: Diagnosis not present

## 2022-04-02 DIAGNOSIS — D2262 Melanocytic nevi of left upper limb, including shoulder: Secondary | ICD-10-CM | POA: Diagnosis not present

## 2022-04-02 DIAGNOSIS — D2272 Melanocytic nevi of left lower limb, including hip: Secondary | ICD-10-CM | POA: Diagnosis not present

## 2022-04-02 DIAGNOSIS — Z1283 Encounter for screening for malignant neoplasm of skin: Secondary | ICD-10-CM | POA: Diagnosis not present

## 2022-04-02 DIAGNOSIS — L570 Actinic keratosis: Secondary | ICD-10-CM | POA: Diagnosis not present

## 2022-04-02 DIAGNOSIS — D2271 Melanocytic nevi of right lower limb, including hip: Secondary | ICD-10-CM | POA: Diagnosis not present

## 2022-04-06 DIAGNOSIS — Z1231 Encounter for screening mammogram for malignant neoplasm of breast: Secondary | ICD-10-CM | POA: Diagnosis not present
